# Patient Record
Sex: Female | Born: 1995 | Race: Black or African American | Hispanic: No | Marital: Single | Smoking: Current every day smoker
Health system: Southern US, Community
[De-identification: ages and names within clinical notes are randomized; demographics above are authoritative.]

## PROBLEM LIST (undated history)

## (undated) ENCOUNTER — Ambulatory Visit (HOSPITAL_COMMUNITY): Admission: EM | Source: Home / Self Care

## (undated) DIAGNOSIS — L309 Dermatitis, unspecified: Secondary | ICD-10-CM

## (undated) DIAGNOSIS — Z789 Other specified health status: Secondary | ICD-10-CM

## (undated) DIAGNOSIS — O24419 Gestational diabetes mellitus in pregnancy, unspecified control: Secondary | ICD-10-CM

## (undated) DIAGNOSIS — A749 Chlamydial infection, unspecified: Secondary | ICD-10-CM

## (undated) HISTORY — DX: Gestational diabetes mellitus in pregnancy, unspecified control: O24.419

## (undated) HISTORY — PX: NO PAST SURGERIES: SHX2092

## (undated) HISTORY — DX: Chlamydial infection, unspecified: A74.9

## (undated) HISTORY — DX: Dermatitis, unspecified: L30.9

---
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
## 2018-09-29 ENCOUNTER — Telehealth: Payer: Self-pay | Admitting: Family Medicine

## 2018-09-29 NOTE — Telephone Encounter (Signed)
Spoke with patient about her appointment on 6/12 @ 10:20. Patient instructed to wear a face mask for the entire appointment and no visitors are allowed. Patient was screened for covid symptoms and denied having any.

## 2018-09-30 ENCOUNTER — Ambulatory Visit (INDEPENDENT_AMBULATORY_CARE_PROVIDER_SITE_OTHER): Payer: Medicaid Other | Admitting: *Deleted

## 2018-09-30 ENCOUNTER — Other Ambulatory Visit: Payer: Self-pay

## 2018-09-30 DIAGNOSIS — Z3201 Encounter for pregnancy test, result positive: Secondary | ICD-10-CM | POA: Diagnosis not present

## 2018-09-30 DIAGNOSIS — Z3200 Encounter for pregnancy test, result unknown: Secondary | ICD-10-CM

## 2018-09-30 LAB — POCT PREGNANCY, URINE: Preg Test, Ur: POSITIVE — AB

## 2018-09-30 MED ORDER — PRENATAL VITAMIN 27-0.8 MG PO TABS: 1.0000 | ORAL_TABLET | Freq: Every day | ORAL | 12 refills | Status: DC

## 2018-09-30 NOTE — Progress Notes (Signed)
Pt presents for pregnancy test. She was advised of result - positive. This is a desired pregnancy and she is very happy. Sure LMP 08/13/18. EDD 05/20/19.  Medication reconciliation completed. Pt advised to begin taking prenatal vitamins and begin prenatal care. She is having some mild nausea. Dietary suggestions given. Pt advised to got to [facility] if she develops severe abdominal/pelvic pain or vaginal bleeding. Pt voiced understanding of all information and instructions given.

## 2018-10-07 NOTE — Progress Notes (Signed)
I have reviewed the chart and agree with nursing staff's documentation of this patient's encounter.  Darrell Hauk, MD 10/07/2018 3:06 PM    

## 2018-11-01 ENCOUNTER — Other Ambulatory Visit: Payer: Self-pay

## 2018-11-01 ENCOUNTER — Telehealth (INDEPENDENT_AMBULATORY_CARE_PROVIDER_SITE_OTHER): Payer: Medicaid Other | Admitting: *Deleted

## 2018-11-01 ENCOUNTER — Encounter: Payer: Self-pay | Admitting: *Deleted

## 2018-11-01 VITALS — Ht 67.0 in

## 2018-11-01 DIAGNOSIS — Z3400 Encounter for supervision of normal first pregnancy, unspecified trimester: Secondary | ICD-10-CM

## 2018-11-01 DIAGNOSIS — L309 Dermatitis, unspecified: Secondary | ICD-10-CM | POA: Insufficient documentation

## 2018-11-01 NOTE — Progress Notes (Signed)
[place] pt in order to begin scheduled virtual visit via MyChart video. Pt did not answer. I left a message stating that I will call back in a few minutes.   I connected with  Aggie Cosier on 11/01/18 at 10:40 AM EDT by telephone and verified that I am speaking with the correct person using two identifiers.   I discussed the limitations, risks, security and privacy concerns of performing an evaluation and management service by telephone and the availability of in person appointments. I also discussed with the patient that there may be a patient responsible charge related to this service. The patient expressed understanding and agreed to proceed.  New Ob intake completed. Pt advised that her prenatal care will include a combination of virtual visits as well as face to face visits. Covid visitor restrictions explained. Pt is adopted and did not know pertinent family medical history except that her birth mother died of Breast Cancer. Pt agreed to check BP weekly and record results to [employer]. She has applied for Medicaid however has not yet received her card/letter. She will need BP cuff ordered once she receives Medicaid approval. Pt voiced understanding of all information and instructions given.   Day, Ronnell Freshwater, RN 11/01/2018  11:34 AM

## 2018-11-02 NOTE — Progress Notes (Signed)
Chart reviewed for nurse visit. Agree with plan of care.   Starr [place], [place] 11/02/2018 9:43 AM

## 2018-11-07 ENCOUNTER — Telehealth: Payer: Self-pay | Admitting: Obstetrics and Gynecology

## 2018-11-07 NOTE — Telephone Encounter (Signed)
Called the patient to complete the pre-screen. Left a detailed voicemail of wearing a face mask, sanitizing hands at the sanitizing station upon entering our office, and no visitors or children are allowed due to the COVID19 restrictions. Also informed the patient if she is experiencing any flu like symptoms please call our office to reschedule. °

## 2018-11-08 ENCOUNTER — Ambulatory Visit (INDEPENDENT_AMBULATORY_CARE_PROVIDER_SITE_OTHER): Payer: Medicaid Other | Admitting: Student

## 2018-11-08 ENCOUNTER — Other Ambulatory Visit: Payer: Self-pay

## 2018-11-08 VITALS — BP 121/79 | HR 93 | Wt 163.0 lb

## 2018-11-08 DIAGNOSIS — Z3401 Encounter for supervision of normal first pregnancy, first trimester: Secondary | ICD-10-CM

## 2018-11-08 DIAGNOSIS — Z3400 Encounter for supervision of normal first pregnancy, unspecified trimester: Secondary | ICD-10-CM

## 2018-11-08 DIAGNOSIS — Z3481 Encounter for supervision of other normal pregnancy, first trimester: Secondary | ICD-10-CM | POA: Diagnosis not present

## 2018-11-08 DIAGNOSIS — Z315 Encounter for genetic counseling: Secondary | ICD-10-CM | POA: Diagnosis not present

## 2018-11-08 DIAGNOSIS — Z3A12 12 weeks gestation of pregnancy: Secondary | ICD-10-CM

## 2018-11-08 MED ORDER — AMBULATORY NON FORMULARY MEDICATION: 1.0000 | 0 refills | Status: DC

## 2018-11-08 NOTE — Progress Notes (Signed)
  Subjective:    Christine Bailey is being seen today for her first obstetrical visit.  This is not a planned pregnancy but she is happy about it. She is at [redacted]w[redacted]d gestation. Her obstetrical history is significant for none. Relationship with FOB: significant other, not living together. Patient does not intend to breast feed. Pregnancy history fully reviewed.  Patient reports no complaints.  Review of Systems:   Review of Systems  Constitutional: Negative.   HENT: Negative.   Respiratory: Negative.   Cardiovascular: Negative.   Genitourinary: Negative.   Neurological: Negative.     Objective:     BP 121/79   Pulse 93   Wt 163 lb (73.9 kg)   LMP 08/13/2018 (Approximate)   BMI 25.53 kg/m  Physical Exam  Constitutional: She appears well-developed and well-nourished.  HENT:  Head: Normocephalic.  Neck: Normal range of motion.  Respiratory: Effort normal.  GI: Soft.  Musculoskeletal: Normal range of motion.  Neurological: She is alert.  Skin: Skin is warm and dry.    Exam    Assessment:    Pregnancy: G1P0 Patient Active Problem List   Diagnosis Date Noted  . Supervision of low-risk first pregnancy 11/01/2018  . Eczema 11/01/2018       Plan:     Initial labs drawn. Prenatal vitamins. Problem list reviewed and updated. AFP3 discussed: will do at anatomy scan. . Role of ultrasound in pregnancy discussed; fetal survey: ordered. Amniocentesis discussed: not indicated. Follow up in 8 weeks. 50% of 62min visit spent on counseling and coordination of care.  -Patient enrolled in [facility]; understands that her next visit will be virutal.  -Pap deferred today due to age.  -Discussed birth control; patient does not want hormonal methods.   The nature of [facility] with multiple MDs and other Advanced Practice Providers was explained to patient; also emphasized that residents, students are part of our team.   Mervyn Skeeters  [facility] 11/08/2018

## 2018-11-09 LAB — OBSTETRIC PANEL, INCLUDING HIV
Antibody Screen: NEGATIVE
Basophils Absolute: 0 10*3/uL (ref 0.0–0.2)
Basos: 0 %
EOS (ABSOLUTE): 0.2 10*3/uL (ref 0.0–0.4)
Eos: 2 %
HIV Screen 4th Generation wRfx: NONREACTIVE
Hematocrit: 33.9 % — ABNORMAL LOW (ref 34.0–46.6)
Hemoglobin: 11.9 g/dL (ref 11.1–15.9)
Hepatitis B Surface Ag: NEGATIVE
Immature Grans (Abs): 0 10*3/uL (ref 0.0–0.1)
Immature Granulocytes: 0 %
Lymphocytes Absolute: 1.6 10*3/uL (ref 0.7–3.1)
Lymphs: 16 %
MCH: 32.5 pg (ref 26.6–33.0)
MCHC: 35.1 g/dL (ref 31.5–35.7)
MCV: 93 fL (ref 79–97)
Monocytes Absolute: 0.6 10*3/uL (ref 0.1–0.9)
Monocytes: 6 %
Neutrophils Absolute: 7.9 10*3/uL — ABNORMAL HIGH (ref 1.4–7.0)
Neutrophils: 76 %
Platelets: 267 10*3/uL (ref 150–450)
RBC: 3.66 x10E6/uL — ABNORMAL LOW (ref 3.77–5.28)
RDW: 11.6 % — ABNORMAL LOW (ref 11.7–15.4)
RPR Ser Ql: NONREACTIVE
Rh Factor: POSITIVE
Rubella Antibodies, IGG: 1.06 index (ref >0.99)
WBC: 10.4 10*3/uL (ref 3.4–10.8)

## 2018-11-18 ENCOUNTER — Encounter: Payer: Self-pay | Admitting: *Deleted

## 2018-12-06 ENCOUNTER — Other Ambulatory Visit: Payer: Self-pay

## 2018-12-06 MED ORDER — BLOOD PRESSURE KIT DEVI: 1.0000 | 0 refills | Status: DC | PRN

## 2018-12-10 DIAGNOSIS — Z3400 Encounter for supervision of normal first pregnancy, unspecified trimester: Secondary | ICD-10-CM | POA: Diagnosis not present

## 2018-12-12 ENCOUNTER — Telehealth: Payer: Self-pay

## 2018-12-12 NOTE — Telephone Encounter (Signed)
Notified pt that we have her Bp cuff.  Pt reports that she is unsure of when she will be able to come pick up the cuff.  I verified with pt if she will be able to send a Mychart message when she is available to come pick up cuff.  Pt stated yes once she is able to confirm time she will send MyChart message.

## 2018-12-23 ENCOUNTER — Other Ambulatory Visit: Payer: Self-pay

## 2018-12-23 ENCOUNTER — Ambulatory Visit (HOSPITAL_COMMUNITY)
Admission: RE | Admit: 2018-12-23 | Discharge: 2018-12-23 | Disposition: A | Discharge status: Home / Self Care | Payer: Medicaid Other | Source: Ambulatory Visit | Attending: Obstetrics and Gynecology | Admitting: Obstetrics and Gynecology

## 2018-12-23 ENCOUNTER — Other Ambulatory Visit: Payer: Self-pay | Admitting: Student

## 2018-12-23 DIAGNOSIS — Z148 Genetic carrier of other disease: Secondary | ICD-10-CM | POA: Diagnosis not present

## 2018-12-23 DIAGNOSIS — Z363 Encounter for antenatal screening for malformations: Secondary | ICD-10-CM | POA: Diagnosis not present

## 2018-12-23 DIAGNOSIS — Z3400 Encounter for supervision of normal first pregnancy, unspecified trimester: Secondary | ICD-10-CM

## 2018-12-23 DIAGNOSIS — Z3A18 18 weeks gestation of pregnancy: Secondary | ICD-10-CM | POA: Diagnosis not present

## 2019-01-03 ENCOUNTER — Telehealth (INDEPENDENT_AMBULATORY_CARE_PROVIDER_SITE_OTHER): Payer: Medicaid Other | Admitting: Medical

## 2019-01-03 ENCOUNTER — Other Ambulatory Visit: Payer: Self-pay

## 2019-01-03 ENCOUNTER — Telehealth: Payer: Self-pay

## 2019-01-03 ENCOUNTER — Encounter: Payer: Self-pay | Admitting: Medical

## 2019-01-03 DIAGNOSIS — Z3A20 20 weeks gestation of pregnancy: Secondary | ICD-10-CM

## 2019-01-03 DIAGNOSIS — Z3400 Encounter for supervision of normal first pregnancy, unspecified trimester: Secondary | ICD-10-CM

## 2019-01-03 DIAGNOSIS — Z3402 Encounter for supervision of normal first pregnancy, second trimester: Secondary | ICD-10-CM

## 2019-01-03 NOTE — Telephone Encounter (Signed)
Called pt to make aware that her BP Cuff is waiting for pick up at [place] on [facility] [address] & gave their contact#.No  answer, left VM.

## 2019-01-03 NOTE — Patient Instructions (Addendum)
Second Trimester of Pregnancy  The second trimester is from week 14 through week 27 (month 4 through 6). This is often the time in pregnancy that you feel your best. Often times, morning sickness has lessened or quit. You may have more energy, and you may get hungry more often. Your unborn baby is growing rapidly. At the end of the sixth month, he or she is about 9 inches long and weighs about 1 pounds. You will likely feel the baby move between 18 and 20 weeks of pregnancy. Follow these instructions at home: Medicines  Take over-the-counter and prescription medicines only as told by your doctor. Some medicines are safe and some medicines are not safe during pregnancy.  Take a prenatal vitamin that contains at least 600 micrograms (mcg) of folic acid.  If you have trouble pooping (constipation), take medicine that will make your stool soft (stool softener) if your doctor approves. Eating and drinking   Eat regular, healthy meals.  Avoid raw meat and uncooked cheese.  If you get low calcium from the food you eat, talk to your doctor about taking a daily calcium supplement.  Avoid foods that are high in fat and sugars, such as fried and sweet foods.  If you feel sick to your stomach (nauseous) or throw up (vomit): ? Eat 4 or 5 small meals a day instead of 3 large meals. ? Try eating a few soda crackers. ? Drink liquids between meals instead of during meals.  To prevent constipation: ? Eat foods that are high in fiber, like fresh fruits and vegetables, whole grains, and beans. ? Drink enough fluids to keep your pee (urine) clear or pale yellow. Activity  Exercise only as told by your doctor. Stop exercising if you start to have cramps.  Do not exercise if it is too hot, too humid, or if you are in a place of great height (high altitude).  Avoid heavy lifting.  Wear low-heeled shoes. Sit and stand up straight.  You can continue to have sex unless your doctor tells you not to.  Relieving pain and discomfort  Wear a good support bra if your breasts are tender.  Take warm water baths (sitz baths) to soothe pain or discomfort caused by hemorrhoids. Use hemorrhoid cream if your doctor approves.  Rest with your legs raised if you have leg cramps or low back pain.  If you develop puffy, bulging veins (varicose veins) in your legs: ? Wear support hose or compression stockings as told by your doctor. ? Raise (elevate) your feet for 15 minutes, 3-4 times a day. ? Limit salt in your food. Prenatal care  Write down your questions. Take them to your prenatal visits.  Keep all your prenatal visits as told by your doctor. This is important. Safety  Wear your seat belt when driving.  Make a list of emergency phone numbers, including numbers for family, friends, the hospital, and police and fire departments. General instructions  Ask your doctor about the right foods to eat or for help finding a counselor, if you need these services.  Ask your doctor about local prenatal classes. Begin classes before month 6 of your pregnancy.  Do not use hot tubs, steam rooms, or saunas.  Do not douche or use tampons or scented sanitary pads.  Do not cross your legs for long periods of time.  Visit your dentist if you have not done so. Use a soft toothbrush to brush your teeth. Floss gently.  Avoid all smoking, herbs,   and alcohol. Avoid drugs that are not approved by your doctor.  Do not use any products that contain nicotine or tobacco, such as cigarettes and e-cigarettes. If you need help quitting, ask your doctor.  Avoid cat litter boxes and soil used by cats. These carry germs that can cause birth defects in the baby and can cause a loss of your baby (miscarriage) or stillbirth. Contact a doctor if:  You have mild cramps or pressure in your lower belly.  You have pain when you pee (urinate).  You have bad smelling fluid coming from your vagina.  You continue to  feel sick to your stomach (nauseous), throw up (vomit), or have watery poop (diarrhea).  You have a nagging pain in your belly area.  You feel dizzy. Get help right away if:  You have a fever.  You are leaking fluid from your vagina.  You have spotting or bleeding from your vagina.  You have severe belly cramping or pain.  You lose or gain weight rapidly.  You have trouble catching your breath and have chest pain.  You notice sudden or extreme puffiness (swelling) of your face, hands, ankles, feet, or legs.  You have not felt the baby move in over an hour.  You have severe headaches that do not go away when you take medicine.  You have trouble seeing. Summary  The second trimester is from week 14 through week 27 (months 4 through 6). This is often the time in pregnancy that you feel your best.  To take care of yourself and your unborn baby, you will need to eat healthy meals, take medicines only if your doctor tells you to do so, and do activities that are safe for you and your baby.  Call your doctor if you get sick or if you notice anything unusual about your pregnancy. Also, call your doctor if you need help with the right food to eat, or if you want to know what activities are safe for you. This information is not intended to replace advice given to you by your health care provider. Make sure you discuss any questions you have with your health care provider. Document Released: 07/01/2009 Document Revised: 07/29/2018 Document Reviewed: 05/12/2016 Elsevier Patient Education  2020 Elsevier Inc.  AREA PEDIATRIC/FAMILY PRACTICE PHYSICIANS  [place] ([address]) . [facility] o Chambliss, MD; Eniola, MD; Hale, MD; Hensel, MD; McDiarmid, MD; McIntyer, MD; Neal, MD; Walden, MD o [address] [place], [place] [address] o [phone] o Mon-Fri 8:30-12:30, 1:30-5:00 o Providers come to see babies at [facility] o Accepting  Medicaid . [facility] o Limited providers who accept newborns: Koirala, MD; Morrow, MD; Wolters, MD o [address], [place], [place] [address] o [phone] o Mon-Fri 8:00-5:30 o Babies seen by providers at [facility] o Does NOT accept Medicaid o Please call early in hospitalization for appointment (limited availability)  . [facility] o Mulberry, MD o [address] [place], [place] [address] o [phone] o Mon, Tue, Thur, Fri 8:30-5:00, Wed 10:00-7:00 (closed 1-2pm) o Babies seen by [facility] providers o Accepting Medicaid . [facility] - Pediatrician o Rubin, MD o [address], [place], [place] [address] o [phone] o Mon-Fri 8:30-5:00, Sat 8:30-12:00 o Provider comes to see babies at [facility] o Accepting Medicaid o Must have been referred from current patients or contacted office prior to delivery . [facility]) Leotis Pain, MD; Ave Filter, MD; Luna Fuse, MD; Kennedy Bucker, MD; Konrad Dolores, MD; Kathlene November, MD; Jenne Campus, MD; Lubertha South, MD; Wynetta Emery, MD; Duffy Rhody, MD; Gerre Couch, NP; Shirl [place], NP o [address] [place], [place], [place] [address] o [phone] o Mon, Tue, Thur, Fri 8:30-5:30, Wed 9:30-5:30, Sat 8:30-12:30 o Babies seen by [facility] providers o Accepting Medicaid o Only accepting infants of first-time parents or siblings of current patients [facility] discharge coordinator will make follow-up appointment . Cyril Mourning o [address] [place], [place]  [address] o [phone]   Fax - [phone] . [facility] o [address], [place], [place]  [address] o Phone - [phone]   Fax - [phone] . Lucio Edward o [address], [place], [place]  [address] o [phone]  [place] [phone]) . [facility] Jorge Mandril, MD; Alita Chyle, MD; Princella Ion, MD; MD; Earlene Plater,  MD; Jamesetta Orleans, MD; Alvera Novel, MD; Clarene Duke, MD; Rana Snare, MD; Carmon Ginsberg, MD; Alinda Money, MD; Hosie Poisson, MD; Mayford Knife, MD o [address] [address] [place], [place] [address] o [phone] o Mon-Fri 8:30-5:00 (extended evenings Mon-Thur as needed), Sat-Sun 10:00-1:00 o Providers come to see babies at [facility] o Accepting Medicaid for families of first-time babies and families with all children in the household age 79 and under. Must register with office prior to making appointment (M-F only). [facility] Odella Aquas, NP; Lynelle Doctor, MD; Susann Givens, MD; [place], [place] o [address] [place], [place] [address] o [phone] o Mon-Fri 8:00-5:00 o Babies seen by providers at [facility] o Does NOT accept Medicaid/Commercial Insurance Only . [facility] [place] [facility]  Suzette Battiest, MD; Zachery Dauer, MD; Stefan Church, MD; Sabino Dick, MD; Quitman Livings, MD; Farris Has, MD; Gaynell Face, MD; Betha Loa, MD; Colon Flattery, MD; Clifton James, MD o [address] [place] [place], [place] [address] o [phone] o Mon-Fri 8:30-5:30, Sat (Oct.-Mar.) 9:00-1:00 o Babies seen by providers at [facility] o Accepting [facility] [phone]) . [facility] Dimitri, MD; Sheliah Hatch, MD o [address], [place], [place] [address] o [phone] o Mon-Fri 8:30-5:00, Sat 8:30-12:00 o Providers come to see babies at [facility] o Does NOT accept Medicaid . [facility] Cindy Hazy, [place]; [place], MD; [place], [place]; Wynelle Link, MD; Azucena Cecil, MD o [address] [place], [place] [address] o [phone] o Mon-Fri 8:00-5:00 o Babies seen by providers at [facility] o Does NOT accept Medicaid o Only accepting babies of parents who are patients o Please call early in hospitalization for appointment (limited availability) . [facility] Lamar Benes, MD; Abran Cantor, MD; Early Osmond, MD; Cherre Huger, NP; Hyacinth Meeker, MD; Dwan Bolt, MD; Jarold Motto, NP; Dario Guardian, MD; Talmage Nap,  MD; Maisie Fus, MD; Pricilla Holm, MD; Tama [place], MD o [address] [place], [place], [place] [address] o [phone] o Mon-Fri 8:00-5:00, Sat 9:00-12:00 o Providers come to see babies at [facility] o Does NOT accept [facility] [phone]) . [facility] o Limited providers accepting new patients: Drema Pry, NP; Roberts, PA o [address] [place], [place] [address] o [phone] o Mon-Fri 8:00-5:00 o Babies seen by providers at [facility] o Does NOT accept Medicaid o Only accepting babies of parents who are patients o Please call early in hospitalization for appointment (limited availability) . [facility] Luan Pulling, MD; Nash Dimmer, MD o [address] [place], [place] [address] o [phone] (press 1 to schedule appointment) o Mon-Fri 8:00-5:00 o  Providers come to see babies at [facility] o Does NOT accept Medicaid . [facility] Cristino Martes, MD o [address], [place], [place] [address] o [phone] o Mon-Fri 8:30-5:00 (lunch 12:30-1:00), extended hours by appointment only Wed 5:00-6:30 o Babies seen by [facility] providers o Accepting Medicaid . [facility] Abbot, MD; [place], MD; Hassan Rowan, MD o [address] [place], [place], [place] [address] o [phone] o Mon-Fri 8:00-5:00 o Babies seen by [facility] providers o Does NOT accept Medicaid . Nature conservation officer at [place] [address] Elsworth Soho, MD; Durene Cal, MD; [place], DO o [address] [place], [place] [address] o [phone] o Mon-Fri 8:00-5:00 o Babies seen by [facility] providers o Does NOT accept Medicaid . [facility] o [place], [place]; [place], [place]; [place], NP; Avis Epley, MD; Vonna Kotyk, MD; Clance Boll, MD; Stevphen [place], NP; Arvilla Market, NP; Ann Maki, NP; Otis Dials, NP; Vaughan Basta, MD; Drayton, MD o [address] [place], [place] [address] o [phone] o Mon-Fri 8:30-5:00, Sat 10:00-1:00 o Providers come to see  babies at [facility] o Does NOT accept Medicaid o Free prenatal information session Tuesdays at 4:45pm . [facility] Luna Kitchens, MD; [place], [place]; [place], [place]; [place], [place] o [address] [place] [address] o [phone] o Mon-Fri 7:30-5:30 o Babies seen by [facility] providers . [facility] Children's Doctor o [address], [place], [place]  [address] o [phone]   Fax - [phone]  [place] [phone] & [phone]) . [facility] Alphonsa Overall, MD o [address] [place], [place] [address] o [phone] o Mon-Thur 8:00-6:00 o Providers come to see babies at [facility] o Accepting Medicaid . [facility] Zenon Mayo, NP; Cyndia Bent, MD; [place], [place]; [place], [place] o [address] [place], [place] [address] o [phone] o Mon-Thur 7:30-7:30, Fri 7:30-4:30 o Babies seen by [facility] providers o Accepting Medicaid . [facility] Cheryle Horsfall, MD; Janene Harvey, NP; Vonita Moss, MD o [address], [place], [place] [address] o [phone] o Mon-Fri 8:30-5:00, Sat 8:30-12:00 o Providers come to see babies at [facility] o Accepting Medicaid o Must have "Meet & Greet" appointment at office prior to delivery . [facility] Llana Aliment, MD; Earlene Plater, MD; Lucretia Roers, MD o [address], [place], [place] [address] o [phone] o Mon-Wed 8:00-6:00, Thur-Fri 8:00-5:00, Sat 9:00-12:00 o Providers come to see babies at [facility] o Does NOT accept Medicaid o Only accepting siblings of current patients . [facility]  o [address], [place], [place]  [address] o [phone]   Fax - [phone] . [facility] at [facility] o [phone] [address], [place], [place]  [address] o [phone]   Fax - [phone]  [place]  [phone] & [phone])  . Nature conservation officer at [facility] o [place], DO; [place], DO o [address] [place], [place] [address] o [phone] o Mon-Fri 7:00-5:00 o Babies seen by [facility] providers o Does NOT accept Medicaid . [facility] Ellis Savage, MD; [place], [place]; [place], [place] o [address], [place], [place] [address] o [phone] o Mon-Fri 8:00-5:00 o Babies seen by [facility] providers o Accepting Medicaid . [facility] - [address] Franne Forts, MD; [place], [place]; [place], NP; [place], [place] o [address] [place], [place], [place] [address] o [phone] o Mon-Fri 8:00-5:00 o Babies seen by providers at  [facility] o Accepting Medicaid  [place] ([address]) . [facility] [place] o [place], DO o [address] [place], [place] [address] o [phone] o Mon-Fri 8:00-5:00 o Babies seen by [facility] providers o Does NOT accept Medicaid o Limited availability, please call early in hospitalization to schedule follow-up . [facility] o Calderon, PA; Cummings, MD; Dillard, MD; Martin, PA; Olson, MD; Van[place], PA o [address], [place], [place] [address] o [phone] o Mon-Fri 8:30-5:00, Sat 9:00-12:00 o Babies seen by providers at [facility] o Accepting Medicaid o Please register online then schedule online or call office o [email or web address] . [facility] o Hunter, NP; Kumar, MD; Martin Rogers, PA o [address], [place], [place] [address] o [phone] o Mon-Fri 8:00-5:00 o Babies seen by providers at [facility] o Accepting Medicaid . [facility] o [place], MD; Kristi Fleenor, NP; [place], MD o [address], [place], [place] [address] o [phone] o Mon-Fri  8:00-5:30, Sat&Sun by appointment (phones open at 8:30) o Babies seen by [facility] providers o Accepting Medicaid o Must be a first-time baby or sibling of current patient . [facility] [place]  o [address], [place], [place]  [address] o [phone]   Fax - [phone]  [place] ([phone]) . [facility] [place] [facility] o Brown, PA; Cowen, PA; Rice, MD; Helton, PA; Spry, MD o [address] [place], [place] [address] o [phone] o Mon-Thur 8:00-7:00, Fri 8:00-5:00, Sat 8:00-12:00, Sun 9:00-12:00 o Babies seen by [facility] providers o Accepting Medicaid . [facility] [place] o Coe-Goins, MD; Marshall, MD; Pierre-Louis, MD o [address] [place] [address], [place], [place] [address] o [phone] o Mon-Thur 8:00-5:00 o Babies seen by providers at [facility] o Accepting Medicaid . [facility] o Bratton, MD; Coe-Goins, MD; Hayes, MD; Lewis, MD; List, MD; Lott, MD; Marshall, MD; Moran, MD; O'Neal, MD; Pierre-Louis, MD; Pitonzo, MD; Scholer, MD; Spangle, MD o [address] [place], [place] [address] o [phone] o Mon-Fri 8:00-5:30, Sat (Oct.-Mar.) 9:00-1:00 o Babies seen by providers at [facility] o Accepting Medicaid o Must fill out new patient packet, available online at [email or web address] . [facility] ([facility] o Friddle, NP; [place], NP; Kelly, NP; Logan, MD; Melvin, PA; Poth, MD; Ramadoss, MD; Stanton, NP o [address], [place], [place] [address] o [phone] o Mon-Thur 8:00-5:30, Fri 8:00-5:00 o Babies seen by providers at [facility] o Accepting Medicaid  [place] ([address]) . [facility] o Dixon, PA; [place], MD; Pickard, MD; Tapia, PA o [address], [place], [place] [address] o [phone] o Mon-Fri  8:00-5:00 o Babies seen by providers at [facility] o Accepting Medicaid   [place] ([address]) . [facility] [place] o Masneri, DO; Meyers, MD; Nelson, PA o [address], [place], [place] [address] o [phone] o Mon-Fri 8:00-5:00 o Babies seen by providers at [facility] o Does NOT accept Medicaid o Limited appointment availability, please call early in hospitalization  . [facility] [place] o Kunedd, DO; McGowen, MD o [address], [place], [place] [address]   o [phone] o Mon-Fri 8:00-5:00 o Babies seen by [facility]  providers o Does NOT accept Medicaid . [facility] Su Grand, MD; Guy Sandifer, MD; [place], [place]; [place], [place] [address], [place], [place] [address] o [phone] o Mon-Fri 8:00-5:00 o After hours clinic [facility] [place], [place] [address]) [phone] Mon-Fri 5:00-8:00, Sat 12:00-6:00, Sun 10:00-4:00 o Babies seen by [facility] providers o Accepting Medicaid . [place] [facility] o [address] [place], [place]  [address] o [phone]   Fax - [phone]  [place] [phone]) . Therapist, music at [facility], MD o [address] [place], [place], [place] [address] o [phone] o Mon-Fri 8:00-5:00 o Babies seen by [facility] providers o Does NOT accept Medicaid . [place] [facility] [facility] [place]) Bing Neighbors, MD o [address] [place], [place], [place] [address] o [phone] o Mon-Thur 8:00-7:00, Fri 8:00-5:00, Sat 8:00-12:00 o Babies seen by providers at [facility] o Accepting Medicaid - but does not have vaccinations in office (must be received elsewhere) o Limited availability, please call early in hospitalization  [place] ([address]) . [place], [place] [address] [place] [address] o [phone]  Fax [phone]

## 2019-01-03 NOTE — Progress Notes (Signed)
I connected with Christine Bailey  on 01/03/19 at  9:15 AM EDT by: MyChart and verified that I am speaking with the correct person using two identifiers.  Patient is located at home and provider is located at [facility].     The purpose of this virtual visit is to provide medical care while limiting exposure to the novel coronavirus. I discussed the limitations, risks, security and privacy concerns of performing an evaluation and management service by MyChart and the availability of in person appointments. I also discussed with the patient that there may be a patient responsible charge related to this service. By engaging in this virtual visit, you consent to the provision of healthcare.  Additionally, you authorize for your insurance to be billed for the services provided during this visit.  The patient expressed understanding and agreed to proceed.  The following staff members participated in the virtual visit:  Carver Fila, [place] VISIT NOTE  Subjective:  Christine Bailey is a 23 y.o. G1P0 at [redacted]w[redacted]d  for phone visit for ongoing prenatal care.  She is currently monitored for the following issues for this low-risk pregnancy and has Supervision of low-risk first pregnancy and Eczema on their problem list.  Patient reports no complaints.  Contractions: Not present. Vag. Bleeding: None.  Movement: Present. Denies leaking of fluid.   The following portions of the patient's history were reviewed and updated as appropriate: allergies, current medications, past family history, past medical history, past social history, past surgical history and problem list.   Objective:  There were no vitals filed for this visit. Self-Obtained  Fetal Status:     Movement: Present     Assessment and Plan:  Pregnancy: G1P0 at [redacted]w[redacted]d 1. 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 CMA has re-ordered through [employer] today.   Preterm labor symptoms and general obstetric precautions including but not limited to vaginal bleeding, contractions, leaking of fluid and fetal movement were reviewed in detail with the patient.  Return in about 8 weeks (around 02/28/2019) for LOB, 28 week labs (fasting), In-Person.  No future appointments.   Time spent on virtual visit: 10 minutes  Kerry Hough, PA-C

## 2019-01-03 NOTE — Progress Notes (Signed)
Pt advised that she has not received BP Cuff yet, [place] to check status & they said Cuff is available for pick up. Will call pt to advise.

## 2019-02-23 ENCOUNTER — Other Ambulatory Visit: Payer: Self-pay | Admitting: Lactation Services

## 2019-02-23 DIAGNOSIS — Z3400 Encounter for supervision of normal first pregnancy, unspecified trimester: Secondary | ICD-10-CM

## 2019-02-28 ENCOUNTER — Encounter: Payer: Self-pay | Admitting: Medical

## 2019-02-28 ENCOUNTER — Ambulatory Visit (INDEPENDENT_AMBULATORY_CARE_PROVIDER_SITE_OTHER): Payer: Medicaid Other | Admitting: Medical

## 2019-02-28 ENCOUNTER — Other Ambulatory Visit: Payer: Self-pay

## 2019-02-28 ENCOUNTER — Other Ambulatory Visit: Payer: Medicaid Other

## 2019-02-28 VITALS — BP 124/62 | HR 93 | Wt 183.7 lb

## 2019-02-28 DIAGNOSIS — Z3403 Encounter for supervision of normal first pregnancy, third trimester: Secondary | ICD-10-CM | POA: Diagnosis not present

## 2019-02-28 DIAGNOSIS — Z3A28 28 weeks gestation of pregnancy: Secondary | ICD-10-CM | POA: Diagnosis not present

## 2019-02-28 DIAGNOSIS — Z23 Encounter for immunization: Secondary | ICD-10-CM

## 2019-02-28 DIAGNOSIS — Z3400 Encounter for supervision of normal first pregnancy, unspecified trimester: Secondary | ICD-10-CM

## 2019-02-28 NOTE — Patient Instructions (Signed)
Fetal Movement Counts Patient Name: ________________________________________________ Patient Due Date: ____________________ What is a fetal movement count?  A fetal movement count is the number of times that you feel your baby move during a certain amount of time. This may also be called a fetal kick count. A fetal movement count is recommended for every pregnant woman. You may be asked to start counting fetal movements as early as week 28 of your pregnancy. Pay attention to when your baby is most active. You may notice your baby's sleep and wake cycles. You may also notice things that make your baby move more. You should do a fetal movement count:  When your baby is normally most active.  At the same time each day. A good time to count movements is while you are resting, after having something to eat and drink. How do I count fetal movements? 1. Find a quiet, comfortable area. Sit, or lie down on your side. 2. Write down the date, the start time and stop time, and the number of movements that you felt between those two times. Take this information with you to your health care visits. 3. For 2 hours, count kicks, flutters, swishes, rolls, and jabs. You should feel at least 10 movements during 2 hours. 4. You may stop counting after you have felt 10 movements. 5. If you do not feel 10 movements in 2 hours, have something to eat and drink. Then, keep resting and counting for 1 hour. If you feel at least 4 movements during that hour, you may stop counting. Contact a health care provider if:  You feel fewer than 4 movements in 2 hours.  Your baby is not moving like he or she usually does. Date: ____________ Start time: ____________ Stop time: ____________ Movements: ____________ Date: ____________ Start time: ____________ Stop time: ____________ Movements: ____________ Date: ____________ Start time: ____________ Stop time: ____________ Movements: ____________ Date: ____________ Start time:  ____________ Stop time: ____________ Movements: ____________ Date: ____________ Start time: ____________ Stop time: ____________ Movements: ____________ Date: ____________ Start time: ____________ Stop time: ____________ Movements: ____________ Date: ____________ Start time: ____________ Stop time: ____________ Movements: ____________ Date: ____________ Start time: ____________ Stop time: ____________ Movements: ____________ Date: ____________ Start time: ____________ Stop time: ____________ Movements: ____________ This information is not intended to replace advice given to you by your health care provider. Make sure you discuss any questions you have with your health care provider. Document Released: 05/06/2006 Document Revised: 04/26/2018 Document Reviewed: 05/16/2015 Elsevier Patient Education  2020 Elsevier Inc. Braxton Hicks Contractions Contractions of the uterus can occur throughout pregnancy, but they are not always a sign that you are in labor. You may have practice contractions called Braxton Hicks contractions. These false labor contractions are sometimes confused with true labor. What are Braxton Hicks contractions? Braxton Hicks contractions are tightening movements that occur in the muscles of the uterus before labor. Unlike true labor contractions, these contractions do not result in opening (dilation) and thinning of the cervix. Toward the end of pregnancy (32-34 weeks), Braxton Hicks contractions can happen more often and may become stronger. These contractions are sometimes difficult to tell apart from true labor because they can be very uncomfortable. You should not feel embarrassed if you go to the hospital with false labor. Sometimes, the only way to tell if you are in true labor is for your health care provider to look for changes in the cervix. The health care provider will do a physical exam and may monitor your contractions. If you   are not in true labor, the exam should show  that your cervix is not dilating and your water has not broken. If there are no other health problems associated with your pregnancy, it is completely safe for you to be sent home with false labor. You may continue to have Braxton Hicks contractions until you go into true labor. How to tell the difference between true labor and false labor True labor  Contractions last 30-70 seconds.  Contractions become very regular.  Discomfort is usually felt in the top of the uterus, and it spreads to the lower abdomen and low back.  Contractions do not go away with walking.  Contractions usually become more intense and increase in frequency.  The cervix dilates and gets thinner. False labor  Contractions are usually shorter and not as strong as true labor contractions.  Contractions are usually irregular.  Contractions are often felt in the front of the lower abdomen and in the groin.  Contractions may go away when you walk around or change positions while lying down.  Contractions get weaker and are shorter-lasting as time goes on.  The cervix usually does not dilate or become thin. Follow these instructions at home:   Take over-the-counter and prescription medicines only as told by your health care provider.  Keep up with your usual exercises and follow other instructions from your health care provider.  Eat and drink lightly if you think you are going into labor.  If Braxton Hicks contractions are making you uncomfortable: ? Change your position from lying down or resting to walking, or change from walking to resting. ? Sit and rest in a tub of warm water. ? Drink enough fluid to keep your urine pale yellow. Dehydration may cause these contractions. ? Do slow and deep breathing several times an hour.  Keep all follow-up prenatal visits as told by your health care provider. This is important. Contact a health care provider if:  You have a fever.  You have continuous pain in  your abdomen. Get help right away if:  Your contractions become stronger, more regular, and closer together.  You have fluid leaking or gushing from your vagina.  You pass blood-tinged mucus (bloody show).  You have bleeding from your vagina.  You have low back pain that you never had before.  You feel your baby's head pushing down and causing pelvic pressure.  Your baby is not moving inside you as much as it used to. Summary  Contractions that occur before labor are called Braxton Hicks contractions, false labor, or practice contractions.  Braxton Hicks contractions are usually shorter, weaker, farther apart, and less regular than true labor contractions. True labor contractions usually become progressively stronger and regular, and they become more frequent.  Manage discomfort from Braxton Hicks contractions by changing position, resting in a warm bath, drinking plenty of water, or practicing deep breathing. This information is not intended to replace advice given to you by your health care provider. Make sure you discuss any questions you have with your health care provider. Document Released: 08/20/2016 Document Revised: 03/19/2017 Document Reviewed: 08/20/2016 Elsevier Patient Education  2020 Elsevier Inc.  

## 2019-02-28 NOTE — Progress Notes (Signed)
   PRENATAL VISIT NOTE  Subjective:  Christine Bailey is a 23 y.o. G1P0 at [redacted]w[redacted]d being seen today for ongoing prenatal care.  She is currently monitored for the following issues for this low-risk pregnancy and has Supervision of low-risk first pregnancy and Eczema on their problem list.  Patient reports no complaints.  Contractions: Not present. Vag. Bleeding: None.  Movement: Present. Denies leaking of fluid.   The following portions of the patient's history were reviewed and updated as appropriate: allergies, current medications, past family history, past medical history, past social history, past surgical history and problem list.   Objective:   Vitals:   02/28/19 0848  BP: 124/62  Pulse: 93  Weight: 183 lb 11.2 oz (83.3 kg)    Fetal Status: Fetal Heart Rate (bpm): 148 Fundal Height: 28 cm Movement: Present     General:  Alert, oriented and cooperative. Patient is in no acute distress.  Skin: Skin is warm and dry. No rash noted.   Cardiovascular: Normal heart rate noted  Respiratory: Normal respiratory effort, no problems with respiration noted  Abdomen: Soft, gravid, appropriate for gestational age.  Pain/Pressure: Absent     Pelvic: Cervical exam deferred        Extremities: Normal range of motion.  Edema: Trace  Mental Status: Normal mood and affect. Normal behavior. Normal judgment and thought content.   Assessment and Plan:  Pregnancy: G1P0 at [redacted]w[redacted]d 1. Encounter for supervision of low-risk first pregnancy in third trimester - Doing well, concerned she is not "showing" yet. Discussed that growth appears normal based on last [place] and fundal height today  - 2 hour GTT, CBC, HIV and RPR today  - TDap today - Declined flu  - Still unsure about Peds as she may move to [facility] after the baby is born to be closer to family  - Has BP cuff and brought to office today for teaching with RN    Preterm labor symptoms and general obstetric precautions including but not limited to  vaginal bleeding, contractions, leaking of fluid and fetal movement were reviewed in detail with the patient. Please refer to After Visit Summary for other counseling recommendations.   Return in about 4 weeks (around 03/28/2019) for LOB, Virtual.  No future appointments.  Kerry Hough, PA-C

## 2019-03-01 LAB — CBC
Hematocrit: 32.2 % — ABNORMAL LOW (ref 34.0–46.6)
Hemoglobin: 11.1 g/dL (ref 11.1–15.9)
MCH: 32.6 pg (ref 26.6–33.0)
MCHC: 34.5 g/dL (ref 31.5–35.7)
MCV: 94 fL (ref 79–97)
Platelets: 218 10*3/uL (ref 150–450)
RBC: 3.41 x10E6/uL — ABNORMAL LOW (ref 3.77–5.28)
RDW: 11.6 % — ABNORMAL LOW (ref 11.7–15.4)
WBC: 11.8 10*3/uL — ABNORMAL HIGH (ref 3.4–10.8)

## 2019-03-01 LAB — GLUCOSE TOLERANCE, 2 HOURS W/ 1HR
Glucose, 1 hour: 126 mg/dL (ref 65–179)
Glucose, 2 hour: 133 mg/dL (ref 65–152)
Glucose, Fasting: 77 mg/dL (ref 65–91)

## 2019-03-01 LAB — HIV ANTIBODY (ROUTINE TESTING W REFLEX): HIV Screen 4th Generation wRfx: NONREACTIVE

## 2019-03-01 LAB — RPR: RPR Ser Ql: NONREACTIVE

## 2019-03-28 ENCOUNTER — Encounter: Payer: Self-pay | Admitting: Student

## 2019-03-28 ENCOUNTER — Other Ambulatory Visit: Payer: Self-pay

## 2019-03-28 ENCOUNTER — Telehealth: Payer: Self-pay | Admitting: Student

## 2019-03-28 ENCOUNTER — Telehealth: Payer: Medicaid Other | Admitting: Student

## 2019-03-28 NOTE — Progress Notes (Signed)
@  856am lvm that I will attempt to call back in 5 mins and if I miss her again to call our office to reschedule.  @[phone]  lvm to reschedule appointment

## 2019-03-28 NOTE — Progress Notes (Signed)
Pt did not keep virtual appointment

## 2019-03-28 NOTE — Telephone Encounter (Signed)
Attempted to contact patient to get her rescheduled for her missed appointment. No answer, left voicemail for patient to give the office a call back to be rescheduled. No show letter mailed  

## 2019-03-31 ENCOUNTER — Encounter: Payer: Self-pay | Admitting: Family Medicine

## 2019-04-06 ENCOUNTER — Telehealth (INDEPENDENT_AMBULATORY_CARE_PROVIDER_SITE_OTHER): Payer: Medicaid Other

## 2019-04-06 ENCOUNTER — Other Ambulatory Visit: Payer: Self-pay

## 2019-04-06 DIAGNOSIS — Z3403 Encounter for supervision of normal first pregnancy, third trimester: Secondary | ICD-10-CM

## 2019-04-06 DIAGNOSIS — Z3A33 33 weeks gestation of pregnancy: Secondary | ICD-10-CM

## 2019-04-06 NOTE — Patient Instructions (Signed)
Contraception Choices Contraception, also called birth control, refers to methods or devices that prevent pregnancy. Hormonal methods Contraceptive implant  A contraceptive implant is a thin, plastic tube that contains a hormone. It is inserted into the upper part of the arm. It can remain in place for up to 3 years. Progestin-only injections Progestin-only injections are injections of progestin, a synthetic form of the hormone progesterone. They are given every 3 months by a health care provider. Birth control pills  Birth control pills are pills that contain hormones that prevent pregnancy. They must be taken once a day, preferably at the same time each day. Birth control patch  The birth control patch contains hormones that prevent pregnancy. It is placed on the skin and must be changed once a week for three weeks and removed on the fourth week. A prescription is needed to use this method of contraception. Vaginal ring  A vaginal ring contains hormones that prevent pregnancy. It is placed in the vagina for three weeks and removed on the fourth week. After that, the process is repeated with a new ring. A prescription is needed to use this method of contraception. Emergency contraceptive Emergency contraceptives prevent pregnancy after unprotected sex. They come in pill form and can be taken up to 5 days after sex. They work best the sooner they are taken after having sex. Most emergency contraceptives are available without a prescription. This method should not be used as your only form of birth control. Barrier methods Female condom  A female condom is a thin sheath that is worn over the penis during sex. Condoms keep sperm from going inside a woman's body. They can be used with a spermicide to increase their effectiveness. They should be disposed after a single use. Female condom  A female condom is a soft, loose-fitting sheath that is put into the vagina before sex. The condom keeps sperm  from going inside a woman's body. They should be disposed after a single use. Diaphragm  A diaphragm is a soft, dome-shaped barrier. It is inserted into the vagina before sex, along with a spermicide. The diaphragm blocks sperm from entering the uterus, and the spermicide kills sperm. A diaphragm should be left in the vagina for 6-8 hours after sex and removed within 24 hours. A diaphragm is prescribed and fitted by a health care provider. A diaphragm should be replaced every 1-2 years, after giving birth, after gaining more than 15 lb (6.8 kg), and after pelvic surgery. Cervical cap  A cervical cap is a round, soft latex or plastic cup that fits over the cervix. It is inserted into the vagina before sex, along with spermicide. It blocks sperm from entering the uterus. The cap should be left in place for 6-8 hours after sex and removed within 48 hours. A cervical cap must be prescribed and fitted by a health care provider. It should be replaced every 2 years. Sponge  A sponge is a soft, circular piece of polyurethane foam with spermicide on it. The sponge helps block sperm from entering the uterus, and the spermicide kills sperm. To use it, you make it wet and then insert it into the vagina. It should be inserted before sex, left in for at least 6 hours after sex, and removed and thrown away within 30 hours. Spermicides Spermicides are chemicals that kill or block sperm from entering the cervix and uterus. They can come as a cream, jelly, suppository, foam, or tablet. A spermicide should be inserted into the   vagina with an applicator at least 10-15 minutes before sex to allow time for it to work. The process must be repeated every time you have sex. Spermicides do not require a prescription. Intrauterine contraception Intrauterine device (IUD) An IUD is a T-shaped device that is put in a woman's uterus. There are two types:  Hormone IUD.This type contains progestin, a synthetic form of the hormone  progesterone. This type can stay in place for 3-5 years.  Copper IUD.This type is wrapped in copper wire. It can stay in place for 10 years.  Permanent methods of contraception Female tubal ligation In this method, a woman's fallopian tubes are sealed, tied, or blocked during surgery to prevent eggs from traveling to the uterus. Hysteroscopic sterilization In this method, a small, flexible insert is placed into each fallopian tube. The inserts cause scar tissue to form in the fallopian tubes and block them, so sperm cannot reach an egg. The procedure takes about 3 months to be effective. Another form of birth control must be used during those 3 months. Female sterilization This is a procedure to tie off the tubes that carry sperm (vasectomy). After the procedure, the man can still ejaculate fluid (semen). Natural planning methods Natural family planning In this method, a couple does not have sex on days when the woman could become pregnant. Calendar method This means keeping track of the length of each menstrual cycle, identifying the days when pregnancy can happen, and not having sex on those days. Ovulation method In this method, a couple avoids sex during ovulation. Symptothermal method This method involves not having sex during ovulation. The woman typically checks for ovulation by watching changes in her temperature and in the consistency of cervical mucus. Post-ovulation method In this method, a couple waits to have sex until after ovulation. Summary  Contraception, also called birth control, means methods or devices that prevent pregnancy.  Hormonal methods of contraception include implants, injections, pills, patches, vaginal rings, and emergency contraceptives.  Barrier methods of contraception can include female condoms, female condoms, diaphragms, cervical caps, sponges, and spermicides.  There are two types of IUDs (intrauterine devices). An IUD can be put in a woman's uterus to  prevent pregnancy for 3-5 years.  Permanent sterilization can be done through a procedure for males, females, or both.  Natural family planning methods involve not having sex on days when the woman could become pregnant. This information is not intended to replace advice given to you by your health care provider. Make sure you discuss any questions you have with your health care provider. Document Released: 04/06/2005 Document Revised: 04/08/2017 Document Reviewed: 05/09/2016 Elsevier Patient Education  2020 Elsevier Inc.  

## 2019-04-06 NOTE — Progress Notes (Signed)
I connected with  Aggie Cosier on 04/06/19 at 11:15 AM EST by telephone and verified that I am speaking with the correct person using two identifiers.   I discussed the limitations, risks, security and privacy concerns of performing an evaluation and management service by telephone and the availability of in person appointments. I also discussed with the patient that there may be a patient responsible charge related to this service. The patient expressed understanding and agreed to proceed.  Alric Seton, CMA 04/06/2019  11:13 AM   Not at home to take bp will update in Baby Scripts later Explained kick counts to patient

## 2019-04-06 NOTE — Progress Notes (Signed)
   TELEHEALTH OBSTETRICS PRENATAL VIRTUAL VIDEO VISIT ENCOUNTER NOTE  Provider location: [facility] for [facility] at [place]   I connected with Christine Bailey on 04/06/19 at 11:15 AM EST byMyChart Video Encounter at home and verified that I am speaking with the correct person using two identifiers.   I discussed the limitations, risks, security and privacy concerns of performing an evaluation and management service virtually and the availability of in person appointments. I also discussed with the patient that there may be a patient responsible charge related to this service. The patient expressed understanding and agreed to proceed. Subjective:  Christine Bailey is a 23 y.o. G1P0 at [redacted]w[redacted]d being seen today for ongoing prenatal care.  She is currently monitored for the following issues for this low-risk pregnancy and has Supervision of low-risk first pregnancy and Eczema on their problem list.  Patient reports no complaints.  Contractions: Not present. Vag. Bleeding: None.  Movement: Present. Denies any leaking of fluid.   Patient questions when she will have her next in office appt.  Patient states that she worries that she will deliver at 36 weeks.  Patient denies contractions and endorses good fetal movement.  She also denies bleeding, lof, and abnormal vaginal discharge. Patient also unsure of what type of birth control method she will use after delivery, but does desire a method.   The following portions of the patient's history were reviewed and updated as appropriate: allergies, current medications, past family history, past medical history, past social history, past surgical history and problem list.   Objective:  There were no vitals filed for this visit.  Fetal Status:     Movement: Present     General:  Alert, oriented and cooperative. Patient is in no acute distress.  Respiratory: Normal respiratory effort, no problems with respiration noted  Mental Status: Normal mood and affect.  Normal behavior. Normal judgment and thought content.  Rest of physical exam deferred due to type of encounter  Imaging: No results found.  Assessment and Plan:  Pregnancy: G1P0 at [redacted]w[redacted]d 1. Encounter for supervision of low-risk first pregnancy in third trimester -Anticipatory guidance for upcoming appts. -Educated on GBS bacteria including what it is, why we test, and how and when we treat if needed. -Discussed and reviewed postpartum planning including contraception and obtaining a pediatrician. -Encouraged to obtain birth control information from bedsider.org.  Also encouraged to talk to family and friends about methods. -Informed that some methods could be distributed/initiated prior to discharge after delivery.  Preterm labor symptoms and general obstetric precautions including but not limited to vaginal bleeding, contractions, leaking of fluid and fetal movement were reviewed in detail with the patient. I discussed the assessment and treatment plan with the patient. The patient was provided an opportunity to ask questions and all were answered. The patient agreed with the plan and demonstrated an understanding of the instructions. The patient was advised to call back or seek an in-person office evaluation/go to [facility]U at [facility] for any urgent or concerning symptoms. Please refer to After Visit Summary for other counseling recommendations.   I provided 7 minutes of face-to-face time during this encounter.  No follow-ups on file.  No future appointments.  Maryann Conners, [place] for [employer], [place]

## 2019-04-21 NOTE — L&D Delivery Note (Signed)
OB/GYN Faculty Practice Delivery Note  Christine Bailey is a 24 y.o. G1P0 s/p VD at 109w2d. She was admitted for NRFHT in MAU.   ROM: 0h 17m with moderate-meconium stained fluid GBS Status: Positive/-- (01/04 1704) Maximum Maternal Temperature: 98.66F  Labor Progress: . Initial SVE: 1.5/80/-2. Patient received Foley bulb, Pitocin and epidural. SROM occurred. She then progressed to complete.   Delivery Date/Time: 2/1 @ 0410 Delivery: Called to room and patient was complete and pushing. Head delivered in LOA position. No nuchal cord present. Shoulder and body delivered in usual fashion. Infant with spontaneous cry, placed on mother's abdomen, dried and stimulated. Cord clamped x 2 after 1-minute delay, and cut by FOB. Cord blood drawn. Placenta delivered spontaneously with gentle cord traction. Fundus firm with massage and Pitocin. Labia, perineum, vagina, and cervix inspected inspected with no lacerations.  Baby Weight: pending  Placenta: Sent to L&D Complications: None Lacerations: None EBL: 110 mL Analgesia: Epidural   Infant: APGAR (1 MIN): 8   APGAR (5 MINS): 9   APGAR (10 MINS):     Jerilynn Birkenhead, MD OB Family Medicine Fellow, [facility] for [facility], [facility] 05/22/2019, 4:25 AM

## 2019-04-21 NOTE — L&D Delivery Note (Signed)
OB/GYN Faculty Practice Delivery Note  Christine Bailey is a 24 y.o. G2P1001 s/p SVD at [redacted]w[redacted]d. She was admitted for IOL for A1GDM.   ROM: 3h 68m with clear fluid GBS Status:  Negative/-- (11/30 1156) Maximum Maternal Temperature: 98  Labor Progress: . Initial SVE: 3 cm. Received cytotec x3 then started on Pitocin. SROM at 0000. She then progressed to complete.   Delivery Date/Time: 12/22 at 0250 Delivery: Called to room and patient was complete and pushing. Head delivered ROA. No nuchal cord present. Shoulder and body delivered in usual fashion. Infant with spontaneous cry, placed on mother's abdomen, dried and stimulated. Cord clamped x 2 after 1-minute delay, and cut by FOB under my supervision. Cord blood drawn. Placenta delivered spontaneously with gentle cord traction. Fundus firm with massage and Pitocin. Labia, perineum, vagina, and cervix inspected inspected with no lacerations.  Baby Weight: pending  Placenta: Sent to L&D Complications: None Lacerations: none EBL: 100 mL Analgesia: none   Infant:  APGAR (1 MIN): 8  APGAR (5 MINS):9   APGAR (10 MINS):     Casper Harrison, MD [facility] Family Medicine Fellow, [facility] for [facility], [facility] 04/10/2020, 3:15 AM

## 2019-04-24 ENCOUNTER — Other Ambulatory Visit: Payer: Self-pay

## 2019-04-24 ENCOUNTER — Encounter: Payer: Self-pay | Admitting: Nurse Practitioner

## 2019-04-24 ENCOUNTER — Other Ambulatory Visit (HOSPITAL_COMMUNITY)
Admission: RE | Admit: 2019-04-24 | Discharge: 2019-04-24 | Disposition: A | Discharge status: Home / Self Care | Payer: Medicaid Other | Source: Ambulatory Visit | Attending: Nurse Practitioner | Admitting: Nurse Practitioner

## 2019-04-24 ENCOUNTER — Ambulatory Visit (INDEPENDENT_AMBULATORY_CARE_PROVIDER_SITE_OTHER): Payer: Medicaid Other | Admitting: Nurse Practitioner

## 2019-04-24 VITALS — BP 125/77 | HR 96 | Wt 197.4 lb

## 2019-04-24 DIAGNOSIS — O26849 Uterine size-date discrepancy, unspecified trimester: Secondary | ICD-10-CM

## 2019-04-24 DIAGNOSIS — Z3403 Encounter for supervision of normal first pregnancy, third trimester: Secondary | ICD-10-CM | POA: Diagnosis not present

## 2019-04-24 DIAGNOSIS — Z3A36 36 weeks gestation of pregnancy: Secondary | ICD-10-CM

## 2019-04-24 DIAGNOSIS — O26843 Uterine size-date discrepancy, third trimester: Secondary | ICD-10-CM

## 2019-04-24 NOTE — Patient Instructions (Addendum)
Lamaze and upright for birth [facility] for breastfeeding [email or web address]  Childbirth Education Options: [facility] Department Classes:  Childbirth education classes can help you get ready for a positive parenting experience. You can also meet other expectant parents and get free stuff for your baby. Each class runs for five weeks on the same night and costs $45 for the mother-to-be and her support person. Medicaid covers the cost if you are eligible. Call [phone] to register. [facility] Childbirth Education:  [phone] or [phone] or [email or web address]  Baby & Me Class: Discuss newborn & infant parenting and family adjustment issues with other new mothers in a relaxed environment. Each week brings a new speaker or baby-centered activity. We encourage new mothers to join [place] every Thursday at 11:00am. Babies birth until crawling. No registration or fee. Daddy [employer]: This course offers Dads-to-be the tools and knowledge needed to feel confident on their journey to becoming new fathers. Experienced dads, who have been trained as coaches, teach dads-to-be how to hold, comfort, diaper, swaddle and play with their infant while being able to support the new mom as well. A class for men taught by men. $25/dad Big Brother/Big Sister: Let your children share in the joy of a new brother or sister in this special class designed just for them. Class includes discussion about how families care for babies: swaddling, holding, diapering, safety as well as how they can be helpful in their new role. This class is designed for children ages 46 to 30, but any age is welcome. Please register each child individually. $5/child  Mom Talk: This mom-led group offers support and connection to mothers as they journey through the adjustments and struggles of that sometimes overwhelming first year after the birth of a child. Tuesdays at 10:00am and Thursdays at 6:00pm. Babies welcome. No  registration or fee. Breastfeeding Support Group: This group is a mother-to-mother support circle where moms have the opportunity to share their breastfeeding experiences. A Lactation Consultant is present for questions and concerns. Meets each Tuesday at 11:00am. No fee or registration. Breastfeeding Your Baby: Learn what to expect in the first days of breastfeeding your newborn.  This class will help you feel more confident with the skills needed to begin your breastfeeding experience. Many new mothers are concerned about breastfeeding after leaving the hospital. This class will also address the most common fears and challenges about breastfeeding during the first few weeks, months and beyond. (call for fee) Comfort Techniques and Tour: This 2 hour interactive class will provide you the opportunity to learn & practice hands-on techniques that can help relieve some of the discomfort of labor and encourage your baby to rotate toward the best position for birth. You and your partner will be able to try a variety of labor positions with birth balls and rebozos as well as practice breathing, relaxation, and visualization techniques. A tour of the [facility] is included with this class. $20 per registrant and support person Childbirth Class- Weekend Option: This class is a Weekend version of our Birth & Baby series. It is designed for parents who have a difficult time fitting several weeks of classes into their schedule. It covers the care of your newborn and the basics of labor and childbirth. It also includes a [place] [facility] and lunch. The class is held two consecutive days: beginning on Friday evening from 6:30 - 8:30 p.m. and the next day, Saturday from 9 a.m. -  4 p.m. (call for fee) Waterbirth Class: Interested in a waterbirth?  This informational class will help you discover whether waterbirth is the right fit for you. Education about waterbirth  itself, supplies you would need and how to assemble your support team is what you can expect from this class. Some obstetrical practices require this class in order to pursue a waterbirth. (Not all obstetrical practices offer waterbirth-check with your healthcare provider.) Register only the expectant mom, but you are encouraged to bring your partner to class! Required if planning waterbirth, no fee. Infant/Child CPR: Parents, grandparents, babysitters, and friends learn Cardio-Pulmonary Resuscitation skills for infants and children. You will also learn how to treat both conscious and unconscious choking in infants and children. This Family & Friends program does not offer certification. Register each participant individually to ensure that enough mannequins are available. (Call for fee) Grandparent Love: Expecting a grandbaby? This class is for you! Learn about the latest infant care and safety recommendations and ways to support your own child as he or she transitions into the parenting role. Taught by Registered Nurses who are childbirth instructors, but most importantly...they are grandmothers too! $10/person. Childbirth Class- Natural Childbirth: This series of 5 weekly classes is for expectant parents who want to learn and practice natural methods of coping with the process of labor and childbirth. Relaxation, breathing, massage, visualization, role of the partner, and helpful positioning are highlighted. Participants learn how to be confident in their body's ability to give birth. This class will empower and help parents make informed decisions about their own care. Includes discussion that will help new parents transition into the immediate postpartum period. [facility] is included. We suggest taking this class between 25-32 weeks, but it's only a recommendation. $75 per registrant and one support person or $30 Medicaid. Childbirth Class- 3 week Series: This option of  3 weekly classes helps you and your labor partner prepare for childbirth. Newborn care, labor & birth, cesarean birth, pain management, and comfort techniques are discussed and a [place] [facility] is included. The class meets at the same time, on the same day of the week for 3 consecutive weeks beginning with the starting date you choose. $60 for registrant and one support person.  Marvelous Multiples: Expecting twins, triplets, or more? This class covers the differences in labor, birth, parenting, and breastfeeding issues that face multiples' parents. NICU tour is included. Led by a Certified Childbirth Educator who is the mother of twins. No fee. Caring for Baby: This class is for expectant and adoptive parents who want to learn and practice the most up-to-date newborn care for their babies. Focus is on birth through the first six weeks of life. Topics include feeding, bathing, diapering, crying, umbilical cord care, circumcision care and safe sleep. Parents learn to recognize symptoms of illness and when to call the pediatrician. Register only the mom-to-be and your partner or support person can plan to come with you! $10 per registrant and support person Childbirth Class- online option: This online class offers you the freedom to complete a Birth and Baby series in the comfort of your own home. The flexibility of this option allows you to review sections at your own pace, at times convenient to you and your support people. It includes additional video information, animations, quizzes, and extended activities. Get organized with helpful eClass tools, checklists, and trackers. Once you register online for the class, you will receive an email within a  few days to accept the invitation and begin the class when the time is right for you. The content will be available to you for 60 days. $60 for 60 days of online access for you and your support people.  Local Doulas: Natural Baby  Doulas [email or web address] Tel: [phone] [email or web address] [employer] [phone] [email or web address] The Labor Hassell Halim  (also do waterbirth tub rental) [phone] [email or web address] [email or web address] Doula [phone] [email or web address] [facility] Rhythms  [phone] [email or web address] [employer] (PADA) [email or web address] [place] Birth and Baby  [email or web address] Considering Waterbirth? Guide for patients at [facility] [employer]  Why consider waterbirth?  . Gentle birth for babies . Less pain medicine used in labor . May allow for passive descent/less pushing . May reduce perineal tears  . More mobility and instinctive maternal position changes . Increased maternal relaxation . Reduced blood pressure in labor  Is waterbirth safe? What are the risks of infection, drowning or other complications?  . Infection: o Very low risk (3.7 % for tub vs 4.8% for bed) o 7 in 8000 waterbirths with documented infection o Poorly cleaned equipment most common cause o Slightly lower group B strep transmission rate  . Drowning o Maternal:  - Very low risk   - Related to seizures or fainting o Newborn:  - Very low risk. No evidence of increased risk of respiratory problems in multiple large studies - Physiological protection from breathing under water - Avoid underwater birth if there are any fetal complications - Once baby's head is out of the water, keep it out.  . Birth complication o Some reports of cord trauma, but risk decreased by bringing baby to surface gradually o No evidence of increased risk of shoulder dystocia. Mothers can usually change positions faster in water than in a bed, possibly aiding the maneuvers to free the shoulder.   You  must attend a Doren Custard class at [facility]  3rd Wednesday of every month from 7-9pm  [employer] by calling [phone] or online at [email or web address]  Bring [place] the certificate from the class to your prenatal appointment  Meet with a midwife at 36 weeks to see if you can still plan a waterbirth and to sign the consent.   Purchase or rent the following supplies:   Water Birth Pool (Birth Pool in a Box or [place] for instance)  (Tubs start ~$125)  Single-use disposable tub liner designed for your brand of tub  New garden hose labeled "lead-free", "suitable for drinking water",  Electric drain pump to remove water (We recommend 792 gallon per hour or greater pump.)   Separate garden hose to remove the dirty water  Fish net  Bathing suit top (optional)  Long-handled mirror (optional)  Places to purchase or rent supplies  [email or web address] for tub purchases and supplies  Waterbirthsolutions.com for tub purchases and supplies  The Labor Ladies ([email or web address]) $275 for tub rental/set-up & take down/kit   [employer] (http://www.[place]ming.[email or web address]) Information regarding doulas (labor support) who provide pool rentals  Our practice has a Birth Pool in a Box tub at the hospital that you may borrow on a first-come-first-served basis. It is your responsibility to to set up, clean and break down the tub. We cannot guarantee the availability of this tub in advance. You are responsible for bringing all accessories listed above. If you do not have all necessary supplies you cannot have a waterbirth.    Things that would prevent you from having a waterbirth:  Premature, <37wks  Previous cesarean birth  Presence of thick meconium-stained fluid  Multiple gestation (Twins, triplets, etc.)  Uncontrolled diabetes or gestational diabetes requiring medication  Hypertension requiring medication or diagnosis of pre-eclampsia  Heavy  vaginal bleeding  Non-reassuring fetal heart rate  Active infection (MRSA, etc.). Group B Strep is NOT a contraindication for  waterbirth.  If your labor has to be induced and induction method requires continuous  monitoring of the baby's heart rate  Other risks/issues identified by your obstetrical provider  Please remember that birth is unpredictable. Under certain unforeseeable circumstances your provider may advise against giving birth in the tub. These decisions will be made on a case-by-case basis and with the safety of you and your baby as our highest priority.      Intrauterine Device Information An intrauterine device (IUD) is a medical device that is inserted in the uterus to prevent pregnancy. It is a small, T-shaped device that has one or two nylon strings hanging down from it. The strings hang out of the lower part of the uterus (cervix) to allow for future IUD removal. There are two types of IUDs available:  Hormone IUD. This type of IUD is made of plastic and contains the hormone progestin (synthetic progesterone). A hormone IUD may last 3-5 years.  Copper IUD. This type of IUD has copper wire wrapped around it. A copper IUD may last up to 10 years. How is an IUD inserted? An IUD is inserted through the vagina and placed into the uterus with a minor medical procedure. The exact procedure for IUD insertion may vary among health care providers and hospitals. How does an IUD work? Synthetic progesterone in a hormonal IUD prevents pregnancy by:  Thickening cervical mucus to prevent sperm from entering the uterus.  Thinning the uterine lining to prevent a fertilized egg from being implanted there. Copper in a copper IUD prevents pregnancy by making the uterus and fallopian tubes produce a fluid that kills sperm. What are the advantages of an IUD? Advantages of either type of IUD  It is highly effective in preventing pregnancy.  It is reversible. You can become pregnant  shortly after the IUD is removed.  It is low-maintenance and can stay in place for a long time.  There are no estrogen-related side effects.  It can be used when breastfeeding.  It is not associated with weight gain.  It can be inserted right after childbirth, an abortion, or a miscarriage. Advantages of a hormone IUD  If it is inserted within 7 days of your period starting, it works right after it is inserted. If the hormone IUD is inserted at any other time in your cycle, you will need to use a backup method of birth control for 7 days after insertion.  It can make menstrual periods lighter.  It can reduce menstrual cramping.  It can be used for 3-5 years. Advantages of a copper IUD  It works right after it is inserted.  It can be used as a form of emergency birth control if it is inserted within 5 days after having unprotected sex.  It does not interfere with your body's natural hormones.  It can be used for 10 years. What are the disadvantages of an IUD?  An IUD may cause irregular menstrual bleeding for a period of time after insertion.  You may have pain during insertion and have cramping and vaginal bleeding after insertion.  An IUD may cut the uterus (uterine perforation) when it is inserted. This is  rare.  An IUD may cause pelvic inflammatory disease (PID), which is an infection in the uterus and fallopian tubes. This is rare, and it usually happens during the first 20 days after the IUD is inserted.  A copper IUD can make your menstrual flow heavier and more painful. How is an IUD removed?  You will lie on your back with your knees bent and your feet in footrests (stirrups).  A device will be inserted into your vagina to spread apart the vaginal walls (speculum). This will allow your health care provider to see the strings attached to the IUD.  Your health care provider will use a small instrument (forceps) to grasp the IUD strings and pull firmly until the IUD  is removed. You may have some discomfort when the IUD is removed. Your health care provider may recommend taking over-the-counter pain relievers, such as ibuprofen, before the procedure. You may also have minor spotting for a few days after the procedure. The exact procedure for IUD removal may vary among health care providers and hospitals. Is the IUD right for me? Your health care provider will make sure you are a good candidate for an IUD and will discuss the advantages, disadvantages, and possible side effects with you. Summary  An intrauterine device (IUD) is a medical device that is inserted in the uterus to prevent pregnancy. It is a small, T-shaped device that has one or two nylon strings hanging down from it.  A hormone IUD contains the hormone progestin (synthetic progesterone). A copper IUD has copper wire wrapped around it.  Synthetic progesterone in a hormone IUD prevents pregnancy by thickening cervical mucus and thinning the walls of the uterus. Copper in a copper IUD prevents pregnancy by making the uterus and fallopian tubes produce a fluid that kills sperm.  A hormone IUD can be left in place for 3-5 years. A copper IUD can be left in place for up to 10 years.  An IUD is inserted and removed by a health care provider. You may feel some pain during insertion and removal. Your health care provider may recommend taking over-the-counter pain medicine, such as ibuprofen, before an IUD procedure. This information is not intended to replace advice given to you by your health care provider. Make sure you discuss any questions you have with your health care provider. Document Revised: 03/19/2017 Document Reviewed: 05/05/2016 Elsevier Patient Education  [place].   Signs and Symptoms of Labor Labor is your body's natural process of moving your baby, placenta, and umbilical cord out of your uterus. The process of labor usually starts when your baby is full-term, between 41 and  40 weeks of pregnancy. How will I know when I am close to going into labor? As your body prepares for labor and the birth of your baby, you may notice the following symptoms in the weeks and days before true labor starts:  Having a strong desire to get your home ready to receive your new baby. This is called nesting. Nesting may be a sign that labor is approaching, and it may occur several weeks before birth. Nesting may involve cleaning and organizing your home.  Passing a small amount of thick, bloody mucus out of your vagina (normal bloody show or losing your mucus plug). This may happen more than a week before labor begins, or it might occur right before labor begins as the opening of the cervix starts to widen (dilate). For some women, the entire mucus plug passes at once.  For others, smaller portions of the mucus plug may gradually pass over several days.  Your baby moving (dropping) lower in your pelvis to get into position for birth (lightening). When this happens, you may feel more pressure on your bladder and pelvic bone and less pressure on your ribs. This may make it easier to breathe. It may also cause you to need to urinate more often and have problems with bowel movements.  Having "practice contractions" (Braxton Hicks contractions) that occur at irregular (unevenly spaced) intervals that are more than 10 minutes apart. This is also called false labor. False labor contractions are common after exercise or sexual activity, and they will stop if you change position, rest, or drink fluids. These contractions are usually mild and do not get stronger over time. They may feel like: ? A backache or back pain. ? Mild cramps, similar to menstrual cramps. ? Tightening or pressure in your abdomen. Other early symptoms that labor may be starting soon include:  Nausea or loss of appetite.  Diarrhea.  Having a sudden burst of energy, or feeling very tired.  Mood changes.  Having trouble  sleeping. How will I know when labor has begun? Signs that true labor has begun may include:  Having contractions that come at regular (evenly spaced) intervals and increase in intensity. This may feel like more intense tightening or pressure in your abdomen that moves to your back. ? Contractions may also feel like rhythmic pain in your upper thighs or back that comes and goes at regular intervals. ? For first-time mothers, this change in intensity of contractions often occurs at a more gradual pace. ? Women who have given birth before may notice a more rapid progression of contraction changes.  Having a feeling of pressure in the vaginal area.  Your water breaking (rupture of membranes). This is when the sac of fluid that surrounds your baby breaks. When this happens, you will notice fluid leaking from your vagina. This may be clear or blood-tinged. Labor usually starts within 24 hours of your water breaking, but it may take longer to begin. ? Some women notice this as a gush of fluid. ? Others notice that their underwear repeatedly becomes damp. Follow these instructions at home:   When labor starts, or if your water breaks, call your health care provider or nurse care line. Based on your situation, they will determine when you should go in for an exam.  When you are in early labor, you may be able to rest and manage symptoms at home. Some strategies to try at home include: ? Breathing and relaxation techniques. ? Taking a warm bath or shower. ? Listening to music. ? Using a heating pad on the lower back for pain. If you are directed to use heat:  Place a towel between your skin and the heat source.  Leave the heat on for 20-30 minutes.  Remove the heat if your skin turns bright red. This is especially important if you are unable to feel pain, heat, or cold. You may have a greater risk of getting burned. Get help right away if:  You have painful, regular contractions that are 5  minutes apart or less.  Labor starts before you are [redacted] weeks along in your pregnancy.  You have a fever.  You have a headache that does not go away.  You have bright red blood coming from your vagina.  You do not feel your baby moving.  You have a sudden onset of: ?  Severe headache with vision problems. ? Nausea, vomiting, or diarrhea. ? Chest pain or shortness of breath. These symptoms may be an emergency. If your health care provider recommends that you go to the hospital or birth center where you plan to deliver, do not drive yourself. Have someone else drive you, or call emergency services (911 in the [place] Summary  Labor is your body's natural process of moving your baby, placenta, and umbilical cord out of your uterus.  The process of labor usually starts when your baby is full-term, between 40 and 40 weeks of pregnancy.  When labor starts, or if your water breaks, call your health care provider or nurse care line. Based on your situation, they will determine when you should go in for an exam. This information is not intended to replace advice given to you by your health care provider. Make sure you discuss any questions you have with your health care provider. Document Revised: 01/04/2017 Document Reviewed: 09/11/2016 Elsevier Patient Education  [place].

## 2019-04-24 NOTE — Progress Notes (Signed)
    Subjective:  Christine Bailey is a 24 y.o. G1P0 at [redacted]w[redacted]d being seen today for ongoing prenatal care.  She is currently monitored for the following issues for this low-risk pregnancy and has Supervision of low-risk first pregnancy and Eczema on their problem list.  Patient reports wondering if she needed another ultrasound..  Contractions: Not present. Vag. Bleeding: None.  Movement: Present. Denies leaking of fluid.   The following portions of the patient's history were reviewed and updated as appropriate: allergies, current medications, past family history, past medical history, past social history, past surgical history and problem list. Problem list updated.  Objective:   Vitals:   04/24/19 1313  BP: 125/77  Pulse: 96  Weight: 197 lb 6.4 oz (89.5 kg)    Fetal Status: Fetal Heart Rate (bpm): 145 Fundal Height: 33 cm Movement: Present  Presentation: Undeterminable  General:  Alert, oriented and cooperative. Patient is in no acute distress.  Skin: Skin is warm and dry. No rash noted.   Cardiovascular: Normal heart rate noted  Respiratory: Normal respiratory effort, no problems with respiration noted  Abdomen: Soft, gravid, appropriate for gestational age. Pain/Pressure: Absent     Pelvic:  Cervical exam performed GC/Chlam and GBS done Dilation: Closed Effacement (%): Thick Station: -3  Extremities: Normal range of motion.  Edema: Trace  Mental Status: Normal mood and affect. Normal behavior. Normal judgment and thought content.   Urinalysis:      Assessment and Plan:  Pregnancy: G1P0 at [redacted]w[redacted]d  1. Encounter for supervision of low-risk first pregnancy in third trimester Still does not have BP cuff.  RN helping to investigate and see what is happening.  Reviewed instructions to enter BP into Babyscripts weekly. Advised to check into online classes for childbirth and breastfeeding. Advised to get exercise ball if able and showed how to do some stretching on the ball. Reports baby is  moving well. Brief discussion of IUD as clietn does not want to be pregnant again but has not used birth control before.  - Culture, beta strep (group b only) - GC/Chlamydia probe amp ([place])not at [facility]  2. Uterine size date discrepancy pregnancy Measuring less than dates and will get [place].  - [place] MFM OB FOLLOW UP; Future  Preterm labor symptoms and general obstetric precautions including but not limited to vaginal bleeding, contractions, leaking of fluid and fetal movement were reviewed in detail with the patient. Please refer to After Visit Summary for other counseling recommendations.  Return in about 1 week (around 05/01/2019) for virtuall visit.  Nolene Bernheim, RN, MSN, NP-BC Nurse Practitioner, [facility] for [employer], [facility] 04/24/2019 1:46 PM

## 2019-04-25 ENCOUNTER — Encounter: Payer: Self-pay | Admitting: Nurse Practitioner

## 2019-04-25 DIAGNOSIS — A749 Chlamydial infection, unspecified: Secondary | ICD-10-CM | POA: Insufficient documentation

## 2019-04-25 DIAGNOSIS — O98813 Other maternal infectious and parasitic diseases complicating pregnancy, third trimester: Secondary | ICD-10-CM | POA: Insufficient documentation

## 2019-04-25 LAB — GC/CHLAMYDIA PROBE AMP (~~LOC~~) NOT AT ARMC
Chlamydia: POSITIVE — AB
Comment: NEGATIVE
Comment: NORMAL
Neisseria Gonorrhea: NEGATIVE

## 2019-04-25 MED ORDER — AZITHROMYCIN 250 MG PO TABS: 1000.0000 mg | ORAL_TABLET | Freq: Once | ORAL | 0 refills | Status: AC

## 2019-04-25 NOTE — Addendum Note (Signed)
Addended by: Currie Paris on: 04/25/2019 04:24 PM   Modules accepted: Orders

## 2019-04-26 NOTE — Progress Notes (Signed)
Pt notified via MyChart.    Addison Naegeli, RN

## 2019-04-27 ENCOUNTER — Encounter (HOSPITAL_COMMUNITY): Payer: Self-pay

## 2019-04-27 ENCOUNTER — Other Ambulatory Visit: Payer: Self-pay

## 2019-04-27 ENCOUNTER — Ambulatory Visit (HOSPITAL_COMMUNITY)
Admission: RE | Admit: 2019-04-27 | Discharge: 2019-04-27 | Disposition: A | Discharge status: Home / Self Care | Payer: Medicaid Other | Source: Ambulatory Visit | Attending: Obstetrics and Gynecology | Admitting: Obstetrics and Gynecology

## 2019-04-27 ENCOUNTER — Encounter: Payer: Self-pay | Admitting: Nurse Practitioner

## 2019-04-27 ENCOUNTER — Ambulatory Visit (HOSPITAL_COMMUNITY): Payer: Medicaid Other | Admitting: *Deleted

## 2019-04-27 DIAGNOSIS — Z362 Encounter for other antenatal screening follow-up: Secondary | ICD-10-CM

## 2019-04-27 DIAGNOSIS — Z3A36 36 weeks gestation of pregnancy: Secondary | ICD-10-CM

## 2019-04-27 DIAGNOSIS — O26849 Uterine size-date discrepancy, unspecified trimester: Secondary | ICD-10-CM | POA: Diagnosis not present

## 2019-04-27 DIAGNOSIS — O99820 Streptococcus B carrier state complicating pregnancy: Secondary | ICD-10-CM | POA: Insufficient documentation

## 2019-04-27 DIAGNOSIS — O26843 Uterine size-date discrepancy, third trimester: Secondary | ICD-10-CM | POA: Diagnosis not present

## 2019-04-27 DIAGNOSIS — Z3403 Encounter for supervision of normal first pregnancy, third trimester: Secondary | ICD-10-CM

## 2019-04-27 LAB — CULTURE, BETA STREP (GROUP B ONLY): Strep Gp B Culture: POSITIVE — AB

## 2019-05-01 ENCOUNTER — Telehealth (INDEPENDENT_AMBULATORY_CARE_PROVIDER_SITE_OTHER): Payer: Medicaid Other | Admitting: Student

## 2019-05-01 DIAGNOSIS — Z3403 Encounter for supervision of normal first pregnancy, third trimester: Secondary | ICD-10-CM

## 2019-05-01 DIAGNOSIS — Z3A37 37 weeks gestation of pregnancy: Secondary | ICD-10-CM

## 2019-05-01 NOTE — Progress Notes (Signed)
I connected with  Christine Bailey on 05/01/19 at 10:55 AM EST by telephone and verified that I am speaking with the correct person using two identifiers.   I discussed the limitations, risks, security and privacy concerns of performing an evaluation and management service by telephone and the availability of in person appointments. I also discussed with the patient that there may be a patient responsible charge related to this service. The patient expressed understanding and agreed to proceed.  Janene Madeira Rosalynd Mcwright, CMA 05/01/2019  11:03 AM

## 2019-05-01 NOTE — Progress Notes (Signed)
TELEHEALTH OBSTETRICS PRENATAL VIRTUAL VIDEO VISIT ENCOUNTER NOTE  Provider location: [facility] [place]   I connected with Christine Bailey on 05/01/19 at 10:55 AM EST by MyChart Video Encounter at home and verified that I am speaking with the correct person using two identifiers.   I discussed the limitations, risks, security and privacy concerns of performing an evaluation and management service virtually and the availability of in person appointments. I also discussed with the patient that there may be a patient responsible charge related to this service. The patient expressed understanding and agreed to proceed. Subjective:  Christine Bailey is a 24 y.o. G1P0 at [redacted]w[redacted]d being seen today for ongoing prenatal care.  She is currently monitored for the following issues for this low-risk pregnancy and has Supervision of low-risk first pregnancy; Eczema; Chlamydia infection affecting pregnancy in third trimester; and Group B Streptococcus carrier, +RV culture, currently pregnant on their problem list.  Patient reports no complaints.  Contractions: Not present. Vag. Bleeding: None.  Movement: Present. Denies any leaking of fluid.   The following portions of the patient's history were reviewed and updated as appropriate: allergies, current medications, past family history, past medical history, past social history, past surgical history and problem list.   Objective:   Vitals:   05/01/19 1100  BP: 123/63  Pulse: 89    Fetal Status:     Movement: Present     General:  Alert, oriented and cooperative. Patient is in no acute distress.  Respiratory: Normal respiratory effort, no problems with respiration noted  Mental Status: Normal mood and affect. Normal behavior. Normal judgment and thought content.  Rest of physical exam deferred due to type of encounter  Imaging: [place] MFM OB FOLLOW UP  Result Date: 04/27/2019  ----------------------------------------------------------------------  OBSTETRICS REPORT                       (Signed Final 04/27/2019 02:56 pm) ---------------------------------------------------------------------- Patient Info  ID #:       [id number]                          D.O.B.:  03-Nov-1995 (23 yrs)  Name:       Christine Bailey                    Visit Date: 04/27/2019 02:08 pm ---------------------------------------------------------------------- Performed By  Performed By:     Lenise Arena        Ref. Address:     [address]                    [place]                                                             [address]  Attending:        Ma Rings MD         [facility]  Referred By:      [facility] ---------------------------------------------------------------------- Orders   #  Description                          Code         Ordered By   1  [place] MFM OB FOLLOW UP                  35573.22     Nolene Bernheim  ----------------------------------------------------------------------   #  Order #                    Accession #                 Episode #   1  [id number]                  [id number]                  [id number]  ---------------------------------------------------------------------- Indications   Uterine size-date discrepancy, third trimester O26.843   Encounter for antenatal screening for          Z36.3   malformations   Genetic carrier (SMA)                          Z14.8   [redacted] weeks gestation of pregnancy                Z3A.36  ---------------------------------------------------------------------- Vital Signs  Weight (lb): 197                               Height:        5'7"  BMI:         30.85 ---------------------------------------------------------------------- Fetal Evaluation  Num Of Fetuses:         1  Cardiac Activity:       Observed  Presentation:           Cephalic  Placenta:                Posterior  P. Cord Insertion:      Previously Visualized  Amniotic Fluid  AFI FV:      Within normal limits  AFI Sum(cm)     %Tile       Largest Pocket(cm)  13.9            51          4.44  RUQ(cm)       RLQ(cm)       LUQ(cm)        LLQ(cm)  3.11          4.17          4.44           2.18 ---------------------------------------------------------------------- Biometry  BPD:        89  mm     G. Age:  36w 0d         43  %    CI:        79.86   %    70 - 86  FL/HC:      21.7   %    20.8 - 22.6  HC:      314.7  mm     G. Age:  35w 2d          4  %    HC/AC:      0.98        0.92 - 1.05  AC:      321.2  mm     G. Age:  36w 0d         43  %    FL/BPD:     76.7   %    71 - 87  FL:       68.3  mm     G. Age:  35w 1d         12  %    FL/AC:      21.3   %    20 - 24  LV:        2.2  mm  Est. FW:    2747  gm      6 lb 1 oz     28  % ---------------------------------------------------------------------- OB History  Gravidity:    1         Term:   0        Prem:   0        SAB:   0  TOP:          0       Ectopic:  0        Living: 0 ---------------------------------------------------------------------- Gestational Age  LMP:           36w 5d        Date:  08/13/18                 EDD:   05/20/19  U/S Today:     35w 4d                                        EDD:   05/28/19  Best:          36w 5d     Det. By:  U/S  (12/23/18)          EDD:   05/20/19 ---------------------------------------------------------------------- Anatomy  Cranium:               Appears normal         Aortic Arch:            Previously seen  Cavum:                 Appears normal         Ductal Arch:            Previously seen  Ventricles:            Appears normal         Diaphragm:              Appears normal  Choroid Plexus:        Previously seen        Stomach:                Appears normal, left  sided  Cerebellum:             Previously seen        Abdomen:                Appears normal  Posterior Fossa:       Previously seen        Abdominal Wall:         Previously seen  Nuchal Fold:           Previously seen        Cord Vessels:           Previously seen  Face:                  Orbits and profile     Kidneys:                Appear normal                         previously seen  Lips:                  Previously seen        Bladder:                Appears normal  Thoracic:              Appears normal         Spine:                  Previously seen  Heart:                 Previously seen        Upper Extremities:      Previously seen  RVOT:                  Previously seen        Lower Extremities:      Previously seen  LVOT:                  Previously seen  Other:  Nasal bone visualized. Heels and 5th digit visualized. 3VV  visualized.          Female gender ---------------------------------------------------------------------- Cervix Uterus Adnexa  Cervix  Not visualized (advanced GA >24wks) ---------------------------------------------------------------------- Comments  This patient was seen for a follow up growth scan as her  fundal heights have been measuring less than her dates.  She denies any problems in her current pregnancy and  reports that she has screened negative for gestational  diabetes.  She was informed that the fetal growth and amniotic fluid  level appears appropriate for her gestational age.  No further exams were scheduled in our office. ----------------------------------------------------------------------                   Johnell Comings, MD Electronically Signed Final Report   04/27/2019 02:56 pm ----------------------------------------------------------------------   Assessment and Plan:  Pregnancy: G1P0 at [redacted]w[redacted]d 1. Encounter for supervision of low-risk first pregnancy in third trimester -reviewed previous labs. Her & her partner have both taken chlamydia treatment & have not had intercourse since then.  Discussed TOC at next in person visit -reviewed GBS positive. Denies medication allergies. Will receive antibiotics during labor -Discussed upcoming visits. Will plan for in person visit at 40 wks with antenatal testing. Will plan to set up 41 wk induction at that time  Term labor symptoms and general obstetric precautions including  but not limited to vaginal bleeding, contractions, leaking of fluid and fetal movement were reviewed in detail with the patient. I discussed the assessment and treatment plan with the patient. The patient was provided an opportunity to ask questions and all were answered. The patient agreed with the plan and demonstrated an understanding of the instructions. The patient was advised to call back or seek an in-person office evaluation/go to MAU at [facility] for any urgent or concerning symptoms. Please refer to After Visit Summary for other counseling recommendations.   I provided 8 minutes of face-to-face time during this encounter.  No follow-ups on file.  Future Appointments  Date Time Provider Department Center  05/08/2019 11:15 AM Currie Paris, NP [facility]  05/16/2019 11:15 AM [name], CNM [facility]    Judeth Horn, NP Center for [employer], [facility]

## 2019-05-01 NOTE — Patient Instructions (Signed)
The Maternity Assessment Unit (MAU) is located at the [facility] and [place] at [facility]. The address is: [address] [place], [place], [place] [address]. Please see map below for additional directions.    The Maternity Assessment Unit is designed to help you during your pregnancy, and for up to 6 weeks after delivery, with any pregnancy- or postpartum-related emergencies, if you think you are in labor, or if your water has broken. For example, if you experience nausea and vomiting, vaginal bleeding, severe abdominal or pelvic pain, elevated blood pressure or other problems related to your pregnancy or postpartum time, please come to the Maternity Assessment Unit for assistance.     Signs and Symptoms of Labor Labor is your body's natural process of moving your baby, placenta, and umbilical cord out of your uterus. The process of labor usually starts when your baby is full-term, between 87 and 40 weeks of pregnancy. How will I know when I am close to going into labor? As your body prepares for labor and the birth of your baby, you may notice the following symptoms in the weeks and days before true labor starts:  Having a strong desire to get your home ready to receive your new baby. This is called nesting. Nesting may be a sign that labor is approaching, and it may occur several weeks before birth. Nesting may involve cleaning and organizing your home.  Passing a small amount of thick, bloody mucus out of your vagina (normal bloody show or losing your mucus plug). This may happen more than a week before labor begins, or it might occur right before labor begins as the opening of the cervix starts to widen (dilate). For some women, the entire mucus plug passes at once. For others, smaller portions of the mucus plug may gradually pass over several days.  Your baby moving (dropping) lower in your pelvis to get into position for birth (lightening). When this happens, you may  feel more pressure on your bladder and pelvic bone and less pressure on your ribs. This may make it easier to breathe. It may also cause you to need to urinate more often and have problems with bowel movements.  Having "practice contractions" (Braxton Hicks contractions) that occur at irregular (unevenly spaced) intervals that are more than 10 minutes apart. This is also called false labor. False labor contractions are common after exercise or sexual activity, and they will stop if you change position, rest, or drink fluids. These contractions are usually mild and do not get stronger over time. They may feel like: ? A backache or back pain. ? Mild cramps, similar to menstrual cramps. ? Tightening or pressure in your abdomen. Other early symptoms that labor may be starting soon include:  Nausea or loss of appetite.  Diarrhea.  Having a sudden burst of energy, or feeling very tired.  Mood changes.  Having trouble sleeping. How will I know when labor has begun? Signs that true labor has begun may include:  Having contractions that come at regular (evenly spaced) intervals and increase in intensity. This may feel like more intense tightening or pressure in your abdomen that moves to your back. ? Contractions may also feel like rhythmic pain in your upper thighs or back that comes and goes at regular intervals. ? For first-time mothers, this change in intensity of contractions often occurs at a more gradual pace. ? Women who have given birth before may notice a more rapid progression of contraction changes.  Having a feeling of pressure in the vaginal area.  Your water breaking (rupture of membranes). This is when the sac of fluid that surrounds your baby breaks. When this happens, you will notice fluid leaking from your vagina. This may be clear or blood-tinged. Labor usually starts within 24 hours of your water breaking, but it may take longer to begin. ? Some women notice this as a gush of  fluid. ? Others notice that their underwear repeatedly becomes damp. Follow these instructions at home:   When labor starts, or if your water breaks, call your health care provider or nurse care line. Based on your situation, they will determine when you should go in for an exam.  When you are in early labor, you may be able to rest and manage symptoms at home. Some strategies to try at home include: ? Breathing and relaxation techniques. ? Taking a warm bath or shower. ? Listening to music. ? Using a heating pad on the lower back for pain. If you are directed to use heat:  Place a towel between your skin and the heat source.  Leave the heat on for 20-30 minutes.  Remove the heat if your skin turns bright red. This is especially important if you are unable to feel pain, heat, or cold. You may have a greater risk of getting burned. Get help right away if:  You have painful, regular contractions that are 5 minutes apart or less.  Labor starts before you are [redacted] weeks along in your pregnancy.  You have a fever.  You have a headache that does not go away.  You have bright red blood coming from your vagina.  You do not feel your baby moving.  You have a sudden onset of: ? Severe headache with vision problems. ? Nausea, vomiting, or diarrhea. ? Chest pain or shortness of breath. These symptoms may be an emergency. If your health care provider recommends that you go to the hospital or birth center where you plan to deliver, do not drive yourself. Have someone else drive you, or call emergency services (911 in the [place] Summary  Labor is your body's natural process of moving your baby, placenta, and umbilical cord out of your uterus.  The process of labor usually starts when your baby is full-term, between 73 and 40 weeks of pregnancy.  When labor starts, or if your water breaks, call your health care provider or nurse care line. Based on your situation, they will determine when you  should go in for an exam. This information is not intended to replace advice given to you by your health care provider. Make sure you discuss any questions you have with your health care provider. Document Revised: 01/04/2017 Document Reviewed: 09/11/2016 Elsevier Patient Education  2020 [employer] Inc[employer]     Fetal Movement Counts Patient Name: ________________________________________________ Patient Due Date: ____________________ What is a fetal movement count?  A fetal movement count is the number of times that you feel your baby move during a certain amount of time. This may also be called a fetal kick count. A fetal movement count is recommended for every pregnant woman. You may be asked to start counting fetal movements as early as week 28 of your pregnancy. Pay attention to when your baby is most active. You may notice your baby's sleep and wake cycles. You may also notice things that make your baby move more. You should do a fetal movement count:  When your baby is normally most active.  At the same time each day. A good time to count movements is while you are resting, after having something to eat and drink. How do I count fetal movements? 1. Find a quiet, comfortable area. Sit, or lie down on your side. 2. Write down the date, the start time and stop time, and the number of movements that you felt between those two times. Take this information with you to your health care visits. 3. Write down your start time when you feel the first movement. 4. Count kicks, flutters, swishes, rolls, and jabs. You should feel at least 10 movements. 5. You may stop counting after you have felt 10 movements, or if you have been counting for 2 hours. Write down the stop time. 6. If you do not feel 10 movements in 2 hours, contact your health care provider for further instructions. Your health care provider may want to do additional tests to assess your baby's well-being. Contact a health care provider  if:  You feel fewer than 10 movements in 2 hours.  Your baby is not moving like he or she usually does. Date: ____________ Start time: ____________ Stop time: ____________ Movements: ____________ Date: ____________ Start time: ____________ Stop time: ____________ Movements: ____________ Date: ____________ Start time: ____________ Stop time: ____________ Movements: ____________ Date: ____________ Start time: ____________ Stop time: ____________ Movements: ____________ Date: ____________ Start time: ____________ Stop time: ____________ Movements: ____________ Date: ____________ Start time: ____________ Stop time: ____________ Movements: ____________ Date: ____________ Start time: ____________ Stop time: ____________ Movements: ____________ Date: ____________ Start time: ____________ Stop time: ____________ Movements: ____________ Date: ____________ Start time: ____________ Stop time: ____________ Movements: ____________ This information is not intended to replace advice given to you by your health care provider. Make sure you discuss any questions you have with your health care provider. Document Revised: 11/24/2018 Document Reviewed: 11/24/2018 Elsevier Patient Education  2020 [employer].

## 2019-05-08 ENCOUNTER — Other Ambulatory Visit: Payer: Self-pay

## 2019-05-08 ENCOUNTER — Encounter: Payer: Self-pay | Admitting: Nurse Practitioner

## 2019-05-08 ENCOUNTER — Telehealth (INDEPENDENT_AMBULATORY_CARE_PROVIDER_SITE_OTHER): Payer: Medicaid Other | Admitting: Nurse Practitioner

## 2019-05-08 DIAGNOSIS — Z3A38 38 weeks gestation of pregnancy: Secondary | ICD-10-CM | POA: Diagnosis not present

## 2019-05-08 DIAGNOSIS — Z3403 Encounter for supervision of normal first pregnancy, third trimester: Secondary | ICD-10-CM | POA: Diagnosis not present

## 2019-05-08 NOTE — Progress Notes (Signed)
I connected with  [name] on 05/08/19 at 11:15 AM EST by telephone and verified that I am speaking with the correct person using two identifiers.   I discussed the limitations, risks, security and privacy concerns of performing an evaluation and management service by telephone and the availability of in person appointments. I also discussed with the patient that there may be a patient responsible charge related to this service. The patient expressed understanding and agreed to proceed.  Janene Madeira Cullin Dishman, CMA 05/08/2019  11:16 AM

## 2019-05-08 NOTE — Progress Notes (Signed)
I connected with@ on 05/08/19 at 11:15 AM EST by: MyChart and verified that I am speaking with the correct person using two identifiers.  Patient is located at home and provider is located at [name]     The purpose of this virtual visit is to provide medical care while limiting exposure to the novel coronavirus. I discussed the limitations, risks, security and privacy concerns of performing an evaluation and management service by MyChart and the availability of in person appointments. I also discussed with the patient that there may be a patient responsible charge related to this service. By engaging in this virtual visit, you consent to the provision of healthcare.  Additionally, you authorize for your insurance to be billed for the services provided during this visit.  The patient expressed understanding and agreed to proceed.  The following staff members participated in the virtual visit: Nolene Bernheim, NP and Virgina Evener, CMA    PRENATAL VISIT NOTE  Subjective:  Christine Bailey is a 24 y.o. G1P0 at [redacted]w[redacted]d  for phone visit for ongoing prenatal care.  She is currently monitored for the following issues for this low-risk pregnancy and has Supervision of low-risk first pregnancy; Eczema; Chlamydia infection affecting pregnancy in third trimester; and Group B Streptococcus carrier, +RV culture, currently pregnant on their problem list.  Patient reports a few contractions every day.  Contractions: Not present. Vag. Bleeding: None.  Movement: Present. Denies leaking of fluid.   The following portions of the patient's history were reviewed and updated as appropriate: allergies, current medications, past family history, past medical history, past social history, past surgical history and problem list.   Objective:   Vitals:   05/08/19 1115  BP: 136/82  Pulse: 91   Self-Obtained  Fetal Status:     Movement: Present     Assessment and Plan:  Pregnancy: G1P0 at [redacted]w[redacted]d 1. Encounter for  supervision of low-risk first pregnancy in third trimester Using exercise ball at home Has not seen any online videos about childbirth or breastfeeding. Reviewed skin to skin measures after birth. Reviewed next visits and will be scheduled for induction at 41 weeks.  Term labor symptoms and general obstetric precautions including but not limited to vaginal bleeding, contractions, leaking of fluid and fetal movement were reviewed in detail with the patient.  Return in about 1 week (around 05/15/2019) for already scheduled for appointment but will need to be in person visit for cervical exam.  Next appointment already scheduled with be in person for a cervical exam and TOC for chlamydia.  Treatment was given on 04-26-19 for her and her partner.  Did have intercourse on 05-06-19.  Future Appointments  Date Time Provider Department Center  05/16/2019 11:15 AM [name], CNM [facility]     Time spent on virtual visit: 10 minutes  Currie Paris, NP

## 2019-05-16 ENCOUNTER — Other Ambulatory Visit: Payer: Self-pay

## 2019-05-16 ENCOUNTER — Encounter: Payer: Self-pay | Admitting: Advanced Practice Midwife

## 2019-05-16 ENCOUNTER — Ambulatory Visit (INDEPENDENT_AMBULATORY_CARE_PROVIDER_SITE_OTHER): Payer: Medicaid Other | Admitting: Advanced Practice Midwife

## 2019-05-16 VITALS — BP 128/85 | HR 88 | Wt 198.0 lb

## 2019-05-16 DIAGNOSIS — Z3483 Encounter for supervision of other normal pregnancy, third trimester: Secondary | ICD-10-CM

## 2019-05-16 DIAGNOSIS — Z3480 Encounter for supervision of other normal pregnancy, unspecified trimester: Secondary | ICD-10-CM

## 2019-05-16 DIAGNOSIS — Z3A39 39 weeks gestation of pregnancy: Secondary | ICD-10-CM

## 2019-05-16 NOTE — Progress Notes (Signed)
   PRENATAL VISIT NOTE  Subjective:  Christine Bailey is a 24 y.o. G1P0 at [redacted]w[redacted]d being seen today for ongoing prenatal care.  She is currently monitored for the following issues for this low-risk pregnancy and has Supervision of low-risk first pregnancy; Eczema; Chlamydia infection affecting pregnancy in third trimester; and Group B Streptococcus carrier, +RV culture, currently pregnant on their problem list.  Patient reports no complaints.  Contractions: Not present. Vag. Bleeding: None.  Movement: Present. Denies leaking of fluid.   The following portions of the patient's history were reviewed and updated as appropriate: allergies, current medications, past family history, past medical history, past social history, past surgical history and problem list.   Objective:   Vitals:   05/16/19 1131  BP: 128/85  Pulse: 88  Weight: 198 lb (89.8 kg)    Fetal Status: Fetal Heart Rate (bpm): 140   Movement: Present     General:  Alert, oriented and cooperative. Patient is in no acute distress.  Skin: Skin is warm and dry. No rash noted.   Cardiovascular: Normal heart rate noted  Respiratory: Normal respiratory effort, no problems with respiration noted  Abdomen: Soft, gravid, appropriate for gestational age.  Pain/Pressure: Absent     Pelvic: Cervical exam performed Dilation: Closed Effacement (%): 50 Station: -2  Extremities: Normal range of motion.  Edema: Trace  Mental Status: Normal mood and affect. Normal behavior. Normal judgment and thought content.   Assessment and Plan:  Pregnancy: G1P0 at [redacted]w[redacted]d 1. Supervision of other normal pregnancy, antepartum - routine care - IOL for  - IOL orders placed - Will have patient come in Friday 05/26/2019 to MAU for FB placement.   Term labor symptoms and general obstetric precautions including but not limited to vaginal bleeding, contractions, leaking of fluid and fetal movement were reviewed in detail with the patient. Please refer to After Visit  Summary for other counseling recommendations.   Return in about 1 week (around 05/23/2019) for for post-dates BPP and NST .  Future Appointments  Date Time Provider Department Center  05/24/2019  9:15 AM [facility] NST [facility]  05/24/2019 10:35 AM Marny Lowenstein, PA-C [facility]  05/27/2019  7:30 AM [facility] SCHED ROOM [facility] None    Thressa Sheller DNP, CNM  05/16/19  12:12 PM

## 2019-05-21 ENCOUNTER — Other Ambulatory Visit: Payer: Self-pay

## 2019-05-21 ENCOUNTER — Other Ambulatory Visit: Payer: Self-pay | Admitting: Family Medicine

## 2019-05-21 ENCOUNTER — Inpatient Hospital Stay (HOSPITAL_COMMUNITY)
Admission: AD | Admit: 2019-05-21 | Discharge: 2019-05-23 | DRG: 807 | Disposition: A | Discharge status: Home / Self Care | Payer: Medicaid Other | Attending: Obstetrics & Gynecology | Admitting: Obstetrics & Gynecology

## 2019-05-21 ENCOUNTER — Encounter (HOSPITAL_COMMUNITY): Payer: Self-pay | Admitting: Obstetrics & Gynecology

## 2019-05-21 DIAGNOSIS — O480 Post-term pregnancy: Secondary | ICD-10-CM | POA: Diagnosis not present

## 2019-05-21 DIAGNOSIS — O99824 Streptococcus B carrier state complicating childbirth: Secondary | ICD-10-CM | POA: Diagnosis not present

## 2019-05-21 DIAGNOSIS — Z20822 Contact with and (suspected) exposure to covid-19: Secondary | ICD-10-CM | POA: Diagnosis present

## 2019-05-21 DIAGNOSIS — O99820 Streptococcus B carrier state complicating pregnancy: Secondary | ICD-10-CM

## 2019-05-21 DIAGNOSIS — A749 Chlamydial infection, unspecified: Secondary | ICD-10-CM | POA: Diagnosis present

## 2019-05-21 DIAGNOSIS — Z3A40 40 weeks gestation of pregnancy: Secondary | ICD-10-CM | POA: Diagnosis not present

## 2019-05-21 DIAGNOSIS — O368390 Maternal care for abnormalities of the fetal heart rate or rhythm, unspecified trimester, not applicable or unspecified: Secondary | ICD-10-CM

## 2019-05-21 DIAGNOSIS — O4693 Antepartum hemorrhage, unspecified, third trimester: Secondary | ICD-10-CM | POA: Diagnosis present

## 2019-05-21 DIAGNOSIS — Z3403 Encounter for supervision of normal first pregnancy, third trimester: Secondary | ICD-10-CM

## 2019-05-21 DIAGNOSIS — Z3400 Encounter for supervision of normal first pregnancy, unspecified trimester: Secondary | ICD-10-CM

## 2019-05-21 DIAGNOSIS — O134 Gestational [pregnancy-induced] hypertension without significant proteinuria, complicating childbirth: Secondary | ICD-10-CM | POA: Diagnosis not present

## 2019-05-21 LAB — URINALYSIS, ROUTINE W REFLEX MICROSCOPIC
Bacteria, UA: NONE SEEN
Bilirubin Urine: NEGATIVE
Glucose, UA: NEGATIVE mg/dL
Ketones, ur: NEGATIVE mg/dL
Leukocytes,Ua: NEGATIVE
Nitrite: NEGATIVE
Protein, ur: 30 mg/dL — AB
Specific Gravity, Urine: 1.021 (ref 1.005–1.030)
pH: 6 (ref 5.0–8.0)

## 2019-05-21 LAB — RESPIRATORY PANEL BY RT PCR (FLU A&B, COVID)
Influenza A by PCR: NEGATIVE
Influenza B by PCR: NEGATIVE
SARS Coronavirus 2 by RT PCR: NEGATIVE

## 2019-05-21 LAB — CBC
HCT: 33.5 % — ABNORMAL LOW (ref 36.0–46.0)
Hemoglobin: 11.7 g/dL — ABNORMAL LOW (ref 12.0–15.0)
MCH: 31.9 pg (ref 26.0–34.0)
MCHC: 34.9 g/dL (ref 30.0–36.0)
MCV: 91.3 fL (ref 80.0–100.0)
Platelets: 218 10*3/uL (ref 150–400)
RBC: 3.67 MIL/uL — ABNORMAL LOW (ref 3.87–5.11)
RDW: 13 % (ref 11.5–15.5)
WBC: 9.5 10*3/uL (ref 4.0–10.5)
nRBC: 0 % (ref 0.0–0.2)

## 2019-05-21 LAB — ABO/RH: ABO/RH(D): A POS

## 2019-05-21 LAB — TYPE AND SCREEN
ABO/RH(D): A POS
Antibody Screen: NEGATIVE

## 2019-05-21 MED ORDER — ONDANSETRON HCL 4 MG/2ML IJ SOLN
4.0000 mg | Freq: Four times a day (QID) | INTRAMUSCULAR | Status: DC | PRN
  Administered 2019-05-22: 02:00:00 4 mg via INTRAVENOUS
  Filled 2019-05-21: qty 2

## 2019-05-21 MED ORDER — TERBUTALINE SULFATE 1 MG/ML IJ SOLN: 0.2500 mg | Freq: Once | INTRAMUSCULAR | Status: DC | PRN

## 2019-05-21 MED ORDER — LACTATED RINGERS IV SOLN: INTRAVENOUS | Status: DC

## 2019-05-21 MED ORDER — PENICILLIN G POT IN DEXTROSE 60000 UNIT/ML IV SOLN
3.0000 10*6.[IU] | INTRAVENOUS | Status: DC
  Administered 2019-05-21 – 2019-05-22 (×2): 3 10*6.[IU] via INTRAVENOUS
  Filled 2019-05-21 (×2): qty 50

## 2019-05-21 MED ORDER — LACTATED RINGERS IV SOLN
500.0000 mL | INTRAVENOUS | Status: DC | PRN
  Administered 2019-05-21 – 2019-05-22 (×2): 500 mL via INTRAVENOUS

## 2019-05-21 MED ORDER — OXYTOCIN BOLUS FROM INFUSION
500.0000 mL | Freq: Once | INTRAVENOUS | Status: AC
  Administered 2019-05-22: 04:00:00 500 mL via INTRAVENOUS

## 2019-05-21 MED ORDER — SODIUM CHLORIDE 0.9 % IV SOLN
5.0000 10*6.[IU] | Freq: Once | INTRAVENOUS | Status: AC
  Administered 2019-05-21: 5 10*6.[IU] via INTRAVENOUS
  Filled 2019-05-21: qty 5

## 2019-05-21 MED ORDER — OXYTOCIN 40 UNITS IN NORMAL SALINE INFUSION - SIMPLE MED: 2.5000 [IU]/h | INTRAVENOUS | Status: DC

## 2019-05-21 MED ORDER — OXYTOCIN 40 UNITS IN NORMAL SALINE INFUSION - SIMPLE MED
1.0000 m[IU]/min | INTRAVENOUS | Status: DC
  Administered 2019-05-21: 2 m[IU]/min via INTRAVENOUS
  Filled 2019-05-21: qty 1000

## 2019-05-21 MED ORDER — SOD CITRATE-CITRIC ACID 500-334 MG/5ML PO SOLN: 30.0000 mL | ORAL | Status: DC | PRN

## 2019-05-21 MED ORDER — LIDOCAINE HCL (PF) 1 % IJ SOLN: 30.0000 mL | INTRAMUSCULAR | Status: DC | PRN

## 2019-05-21 MED ORDER — OXYCODONE-ACETAMINOPHEN 5-325 MG PO TABS: 1.0000 | ORAL_TABLET | ORAL | Status: DC | PRN

## 2019-05-21 MED ORDER — OXYCODONE-ACETAMINOPHEN 5-325 MG PO TABS: 2.0000 | ORAL_TABLET | ORAL | Status: DC | PRN

## 2019-05-21 MED ORDER — ACETAMINOPHEN 325 MG PO TABS: 650.0000 mg | ORAL_TABLET | ORAL | Status: DC | PRN

## 2019-05-21 NOTE — H&P (Signed)
OBSTETRIC ADMISSION HISTORY AND PHYSICAL  Christine Bailey is a 24 y.o. female G1P0 with IUP at 67w1dby LMP presenting for vaginal bleeding at term. She reports +FMs, No LOF, no VB, no blurry vision, headaches or peripheral edema, and RUQ pain.  She plans on breast feeding. She request POP or Nexplanon for birth control. She [facility]  Dating: By LMP --->  Estimated Date of Delivery: 05/20/19  Prenatal History/Complications:  Past Medical History: Past Medical History:  Diagnosis Date  . Eczema   . Medical history non-contributory     Past Surgical History: Past Surgical History:  Procedure Laterality Date  . NO PAST SURGERIES      Obstetrical History: OB History    Gravida  1   Para      Term      Preterm      AB      Living  0     SAB      TAB      Ectopic      Multiple      Live Births              Social History Social History   Socioeconomic History  . Marital status: Single    Spouse name: Not on file  . Number of children: Not on file  . Years of education: Not on file  . Highest education level: Not on file  Occupational History    Comment: Works @ [employer]  Tobacco Use  . Smoking status: Never Smoker  . Smokeless tobacco: Never Used  Substance and Sexual Activity  . Alcohol use: Not Currently    Comment: occasionaly - none since pregnancy  . Drug use: Not Currently    Types: Marijuana    Comment: last use 4 months ago  . Sexual activity: Yes    Birth control/protection: None  Other Topics Concern  . Not on file  Social History Narrative  . Not on file   Social Determinants of Health   Financial Resource Strain:   . Difficulty of Paying Living Expenses: Not on file  Food Insecurity: Food Insecurity Present  . Worried About RCharity fundraiserin the Last Year: Sometimes true  . Ran Out of Food in the Last Year: Sometimes true  Transportation Needs: No Transportation Needs  . Lack of Transportation  (Medical): No  . Lack of Transportation (Non-Medical): No  Physical Activity:   . Days of Exercise per Week: Not on file  . Minutes of Exercise per Session: Not on file  Stress:   . Feeling of Stress : Not on file  Social Connections:   . Frequency of Communication with Friends and Family: Not on file  . Frequency of Social Gatherings with Friends and Family: Not on file  . Attends Religious Services: Not on file  . Active Member of Clubs or Organizations: Not on file  . Attends CArchivistMeetings: Not on file  . Marital Status: Not on file    Family History: Family History  Problem Relation Age of Onset  . Cancer Mother        Breast    Allergies: No Known Allergies  Medications Prior to Admission  Medication Sig Dispense Refill Last Dose  . Blood Pressure Monitoring (BLOOD PRESSURE KIT) DEVI 1 Device by Does not apply route as needed. ICD 10:Z34.00 1 Device 0   . Prenatal Vit-Fe Fumarate-FA (PRENATAL VITAMIN) 27-0.8 MG TABS Take 1 tablet  by mouth daily. 30 tablet 12    Review of Systems   All systems reviewed and negative except as stated in HPI  Blood pressure 136/76, pulse 93, temperature 98.8 F (37.1 C), temperature source Oral, resp. rate 16, last menstrual period 08/13/2018. General appearance: alert, cooperative and no distress Lungs: clear to auscultation bilaterally Heart: regular rate and rhythm Abdomen: soft, non-tender; bowel sounds normal Pelvic: n/a Extremities: Homans sign is negative, no sign of DVT DTR's +2 Presentation: cephalic Fetal monitoringBaseline: 140 bpm, Variability: Good {> 6 bpm), Accelerations: Reactive and Decelerations: variable Uterine activityFrequency: Every 5 minutes Dilation: 2 Effacement (%): 80 Exam by:: Sharolyn Douglas CNM    Prenatal labs: ABO, Rh: A/Positive/-- (07/21 1137) Antibody: Negative (07/21 1137) Rubella: 1.06 (07/21 1137) RPR: Non Reactive (11/10 0907)  HBsAg: Negative (07/21 1137)  HIV: Non  Reactive (11/10 1324)  GBS: Positive/-- (01/04 1704)   Prenatal Transfer Tool  Maternal Diabetes: No Genetic Screening: Normal Maternal Ultrasounds/Referrals: Normal Fetal Ultrasounds or other Referrals:  None Maternal Substance Abuse:  No Significant Maternal Medications:  None Significant Maternal Lab Results: Group B Strep positive  Results for orders placed or performed during the hospital encounter of 05/21/19 (from the past 24 hour(s))  Urinalysis, Routine w reflex microscopic   Collection Time: 05/21/19  4:53 PM  Result Value Ref Range   Color, Urine YELLOW YELLOW   APPearance HAZY (A) CLEAR   Specific Gravity, Urine 1.021 1.005 - 1.030   pH 6.0 5.0 - 8.0   Glucose, UA NEGATIVE NEGATIVE mg/dL   Hgb urine dipstick MODERATE (A) NEGATIVE   Bilirubin Urine NEGATIVE NEGATIVE   Ketones, ur NEGATIVE NEGATIVE mg/dL   Protein, ur 30 (A) NEGATIVE mg/dL   Nitrite NEGATIVE NEGATIVE   Leukocytes,Ua NEGATIVE NEGATIVE   RBC / HPF 0-5 0 - 5 RBC/hpf   WBC, UA 0-5 0 - 5 WBC/hpf   Bacteria, UA NONE SEEN NONE SEEN   Squamous Epithelial / LPF 6-10 0 - 5   Mucus PRESENT     Patient Active Problem List   Diagnosis Date Noted  . Post-dates pregnancy 05/21/2019  . Group B Streptococcus carrier, +RV culture, currently pregnant 04/27/2019  . Chlamydia infection affecting pregnancy in third trimester 04/25/2019  . Supervision of low-risk first pregnancy 11/01/2018  . Eczema 11/01/2018    Assessment/Plan:  Christine Bailey is a 24 y.o. G1P0 at 16w1dhere for vaginal bleeding and FHR variables  #Labor: expectant management for now. Consider FB #Pain: Per patient request #FWB: Cat 1 #ID:  GBS pos, PCN #MOF: Breast #MOC:POP or Nexplanon #Circ:  nTrego CNM  05/21/2019, 5:21 PM

## 2019-05-21 NOTE — MAU Note (Signed)
Pt reports to mau with c/o vag bleeding.  Pt reports she noticed some brown mucous dc earlier today, but 30 mins prior to her coming to mau noticed bright red blood when she wiped. States she is not having to wear a pad.  Pt reports last intercourse was 2 days ago.  Pt denies any pain or LOF at this time.  Reports good fetal movement.

## 2019-05-21 NOTE — Progress Notes (Signed)
Labor Progress Note Christine Bailey is a 24 y.o. G1P0 at 40w1dpresented for possible vaginal bleeding and admitted for IOL for NRFHT.  S: Starting to feel ctx. Met patient and discussed plan.   O:  BP 139/85   Pulse 73   Temp 98.2 F (36.8 C) (Oral)   Resp 18   Ht '5\' 7"'$  (1.702 m)   Wt 89.8 kg   LMP 08/13/2018 (Within Days)   BMI 31.01 kg/m  EFM: 145, moderate variability, pos accels, no decels, reactive TOCO: q2-410mCVE: Dilation: 1.5 Effacement (%): 80 Cervical Position: Posterior Station: -2 Presentation: Vertex Exam by:: Dr. FaMarice Potter A&P: [age].o. G1P0 4088w1dme to MAU for possible vaginal bleeding and admitted for IOL for NRFHT (variables). #Labor: Progressing well. [place]ocin [place] Pitocin titration as able. Unchanged cervix thus far. Foley bulb placed. No vaginal bleeding noted with exam. Anticipate SVD.  #Pain: per patient request #FWB: Cat I #GBS positive; PCN #Chlamydia positive 04/23/2018: Treated on 1/6. Will order TOC.   CheChauncey MannD 8:08 PM

## 2019-05-22 ENCOUNTER — Inpatient Hospital Stay (HOSPITAL_COMMUNITY): Payer: Medicaid Other | Admitting: Anesthesiology

## 2019-05-22 ENCOUNTER — Encounter (HOSPITAL_COMMUNITY): Payer: Self-pay | Admitting: Family Medicine

## 2019-05-22 DIAGNOSIS — O99824 Streptococcus B carrier state complicating childbirth: Secondary | ICD-10-CM

## 2019-05-22 DIAGNOSIS — O368390 Maternal care for abnormalities of the fetal heart rate or rhythm, unspecified trimester, not applicable or unspecified: Secondary | ICD-10-CM

## 2019-05-22 DIAGNOSIS — O480 Post-term pregnancy: Secondary | ICD-10-CM

## 2019-05-22 DIAGNOSIS — Z3A40 40 weeks gestation of pregnancy: Secondary | ICD-10-CM

## 2019-05-22 LAB — COMPREHENSIVE METABOLIC PANEL
ALT: 14 U/L (ref 0–44)
AST: 21 U/L (ref 15–41)
Albumin: 3 g/dL — ABNORMAL LOW (ref 3.5–5.0)
Alkaline Phosphatase: 203 U/L — ABNORMAL HIGH (ref 38–126)
Anion gap: 8 (ref 5–15)
BUN: 7 mg/dL (ref 6–20)
CO2: 21 mmol/L — ABNORMAL LOW (ref 22–32)
Calcium: 9.3 mg/dL (ref 8.9–10.3)
Chloride: 107 mmol/L (ref 98–111)
Creatinine, Ser: 0.56 mg/dL (ref 0.44–1.00)
GFR calc Af Amer: >60 mL/min (ref >60)
GFR calc non Af Amer: >60 mL/min (ref >60)
Glucose, Bld: 80 mg/dL (ref 70–99)
Potassium: 3.9 mmol/L (ref 3.5–5.1)
Sodium: 136 mmol/L (ref 135–145)
Total Bilirubin: 0.4 mg/dL (ref 0.3–1.2)
Total Protein: 5.9 g/dL — ABNORMAL LOW (ref 6.5–8.1)

## 2019-05-22 LAB — RPR: RPR Ser Ql: NONREACTIVE

## 2019-05-22 MED ORDER — DIPHENHYDRAMINE HCL 50 MG/ML IJ SOLN: 12.5000 mg | INTRAMUSCULAR | Status: DC | PRN

## 2019-05-22 MED ORDER — ACETAMINOPHEN 325 MG PO TABS: 650.0000 mg | ORAL_TABLET | Freq: Four times a day (QID) | ORAL | Status: DC | PRN

## 2019-05-22 MED ORDER — SODIUM CHLORIDE (PF) 0.9 % IJ SOLN
INTRAMUSCULAR | Status: DC | PRN
  Administered 2019-05-22: 12 mL/h via EPIDURAL

## 2019-05-22 MED ORDER — DIBUCAINE (PERIANAL) 1 % EX OINT: 1.0000 "application " | TOPICAL_OINTMENT | CUTANEOUS | Status: DC | PRN

## 2019-05-22 MED ORDER — PRENATAL MULTIVITAMIN CH
1.0000 | ORAL_TABLET | Freq: Every day | ORAL | Status: DC
  Administered 2019-05-22: 12:00:00 1 via ORAL
  Filled 2019-05-22: qty 1

## 2019-05-22 MED ORDER — EPHEDRINE 5 MG/ML INJ: 10.0000 mg | INTRAVENOUS | Status: DC | PRN

## 2019-05-22 MED ORDER — LIDOCAINE-EPINEPHRINE (PF) 2 %-1:200000 IJ SOLN
INTRAMUSCULAR | Status: DC | PRN
  Administered 2019-05-22 (×2): 2 mL via EPIDURAL

## 2019-05-22 MED ORDER — DIPHENHYDRAMINE HCL 25 MG PO CAPS: 25.0000 mg | ORAL_CAPSULE | Freq: Four times a day (QID) | ORAL | Status: DC | PRN

## 2019-05-22 MED ORDER — ONDANSETRON HCL 4 MG/2ML IJ SOLN: 4.0000 mg | INTRAMUSCULAR | Status: DC | PRN

## 2019-05-22 MED ORDER — PHENYLEPHRINE 40 MCG/ML (10ML) SYRINGE FOR IV PUSH (FOR BLOOD PRESSURE SUPPORT): 80.0000 ug | PREFILLED_SYRINGE | INTRAVENOUS | Status: DC | PRN

## 2019-05-22 MED ORDER — WITCH HAZEL-GLYCERIN EX PADS: 1.0000 "application " | MEDICATED_PAD | CUTANEOUS | Status: DC | PRN

## 2019-05-22 MED ORDER — TETANUS-DIPHTH-ACELL PERTUSSIS 5-2.5-18.5 LF-MCG/0.5 IM SUSP: 0.5000 mL | Freq: Once | INTRAMUSCULAR | Status: DC

## 2019-05-22 MED ORDER — ONDANSETRON HCL 4 MG PO TABS: 4.0000 mg | ORAL_TABLET | ORAL | Status: DC | PRN

## 2019-05-22 MED ORDER — LACTATED RINGERS IV SOLN
500.0000 mL | Freq: Once | INTRAVENOUS | Status: AC
  Administered 2019-05-22: 03:00:00 500 mL via INTRAVENOUS

## 2019-05-22 MED ORDER — IBUPROFEN 600 MG PO TABS
600.0000 mg | ORAL_TABLET | Freq: Three times a day (TID) | ORAL | Status: DC | PRN
  Administered 2019-05-22 – 2019-05-23 (×3): 600 mg via ORAL
  Filled 2019-05-22 (×3): qty 1

## 2019-05-22 MED ORDER — MEASLES, MUMPS & RUBELLA VAC IJ SOLR: 0.5000 mL | Freq: Once | INTRAMUSCULAR | Status: DC

## 2019-05-22 MED ORDER — FENTANYL-BUPIVACAINE-NACL 0.5-0.125-0.9 MG/250ML-% EP SOLN
12.0000 mL/h | EPIDURAL | Status: DC | PRN
  Filled 2019-05-22: qty 250

## 2019-05-22 MED ORDER — BENZOCAINE-MENTHOL 20-0.5 % EX AERO
1.0000 "application " | INHALATION_SPRAY | CUTANEOUS | Status: DC | PRN
  Administered 2019-05-22: 1 via TOPICAL
  Filled 2019-05-22: qty 56

## 2019-05-22 MED ORDER — SENNOSIDES-DOCUSATE SODIUM 8.6-50 MG PO TABS
2.0000 | ORAL_TABLET | ORAL | Status: DC
  Filled 2019-05-22: qty 2

## 2019-05-22 MED ORDER — COCONUT OIL OIL: 1.0000 "application " | TOPICAL_OIL | Status: DC | PRN

## 2019-05-22 MED ORDER — SIMETHICONE 80 MG PO CHEW: 80.0000 mg | CHEWABLE_TABLET | ORAL | Status: DC | PRN

## 2019-05-22 NOTE — Anesthesia Procedure Notes (Signed)
Epidural Patient location during procedure: OB Start time: 05/22/2019 2:40 AM End time: 05/22/2019 2:55 AM  Staffing Anesthesiologist: Elmer Picker, MD Performed: anesthesiologist   Preanesthetic Checklist Completed: patient identified, IV checked, risks and benefits discussed, monitors and equipment checked, pre-op evaluation and timeout performed  Epidural Patient position: sitting Prep: DuraPrep and site prepped and draped Patient monitoring: continuous pulse ox, blood pressure, heart rate and cardiac monitor Approach: midline Location: L3-L4 Injection technique: LOR air  Needle:  Needle type: Tuohy  Needle gauge: 17 G Needle length: 9 cm Needle insertion depth: 7 cm Catheter type: closed end flexible Catheter size: 19 Gauge Catheter at skin depth: 13 cm Test dose: negative  Assessment Sensory level: T8 Events: blood not aspirated, injection not painful, no injection resistance, no paresthesia and negative IV test  Additional Notes Patient identified. Risks/Benefits/Options discussed with patient including but not limited to bleeding, infection, nerve damage, paralysis, failed block, incomplete pain control, headache, blood pressure changes, nausea, vomiting, reactions to medication both or allergic, itching and postpartum back pain. Confirmed with bedside nurse the patient's most recent platelet count. Confirmed with patient that they are not currently taking any anticoagulation, have any bleeding history or any family history of bleeding disorders. Patient expressed understanding and wished to proceed. All questions were answered. Sterile technique was used throughout the entire procedure. Please see nursing notes for vital signs. Test dose was given through epidural catheter and negative prior to continuing to dose epidural or start infusion. Warning signs of high block given to the patient including shortness of breath, tingling/numbness in hands, complete motor block, or  any concerning symptoms with instructions to call for help. Patient was given instructions on fall risk and not to get out of bed. All questions and concerns addressed with instructions to call with any issues or inadequate analgesia.  Reason for block:procedure for pain

## 2019-05-22 NOTE — Progress Notes (Signed)
Labor Progress Note Christine Bailey is a 24 y.o. G1P0 at [redacted]w[redacted]d presented for possible vaginal bleeding and admitted for IOL for NRFHT.  S: Feeling cramping with ctx.   O:  BP (!) 148/80   Pulse 96   Temp 98.5 F (36.9 C) (Oral)   Resp 16   Ht 5\' 7"  (1.702 m)   Wt 89.8 kg   LMP 08/13/2018 (Within Days)   BMI 31.01 kg/m  EFM: 145, moderate variability, pos accels, no decels, reactive TOCO: q2-16m  CVE: Dilation: 5 Effacement (%): 80 Cervical Position: Posterior Station: -2 Presentation: Vertex Exam by:: Bre Price RN   A&P: 24 y.o. G1P0 109w2d came to MAU for possible vaginal bleeding and admitted for IOL for NRFHT (variables). #Labor: Progressing well. Pitocin at 12. S/p FB. Cont Pitocin titration as able. AROM as indicated. Anticipate SVD.  #Pain: per patient request #FWB: Cat I #GBS positive; PCN #Chlamydia positive 04/23/2018: Treated on 1/6. TOC in process.   06/22/2018, MD 2:07 AM

## 2019-05-22 NOTE — Anesthesia Postprocedure Evaluation (Signed)
Anesthesia Post Note  Patient: Christine Bailey  Procedure(s) Performed: AN AD HOC LABOR EPIDURAL     Patient location during evaluation: Mother Baby Anesthesia Type: Epidural Level of consciousness: awake and alert Pain management: pain level controlled Vital Signs Assessment: post-procedure vital signs reviewed and stable Respiratory status: spontaneous breathing, nonlabored ventilation and respiratory function stable Cardiovascular status: stable Postop Assessment: no headache, no backache, epidural receding, no apparent nausea or vomiting, patient able to bend at knees and adequate PO intake Anesthetic complications: no    Last Vitals:  Vitals:   05/22/19 0609 05/22/19 0740  BP: 132/76 130/82  Pulse: 80 79  Resp: 17 20  Temp: 37.2 C 37.5 C  SpO2: 100% 100%    Last Pain:  Vitals:   05/22/19 0740  TempSrc: Oral  PainSc: 0-No pain   Pain Goal:                   Blythe Stanford

## 2019-05-22 NOTE — Discharge Summary (Signed)
Postpartum Discharge Summary      Patient Name: Christine Bailey DOB: 1995/10/14 MRN: [id number]  Date of admission: 05/21/2019 Delivering Provider: Chauncey Mann   Date of discharge: 05/23/2019  Admitting diagnosis: Post-dates pregnancy [O48.0] Intrauterine pregnancy: [redacted]w[redacted]d    Secondary diagnosis:  Active Problems:   Chlamydia infection affecting pregnancy in third trimester   Group B Streptococcus carrier, +RV culture, currently pregnant   Post-dates pregnancy   Non-reassuring electronic fetal monitoring tracing  Additional problems: None     Discharge diagnosis: Term Pregnancy Delivered                                                                                                Post partum procedures:None  Augmentation: Pitocin and Foley Balloon  Complications: None  Hospital course:  Onset of Labor With Vaginal Delivery     24y.o. yo G1P0 at 455w2das admitted in Latent Labor on 05/21/2019. Patient had an uncomplicated labor course as follows: Presented to MAU for a labor check and noted to Cat II FHT. Initial SVE: 1.5/80/-2. Patient received Foley bulb, Pitocin and epidural. SROM occurred. She then progressed to complete.  Membrane Rupture Time/Date: 3:46 AM ,05/22/2019   Intrapartum Procedures: Episiotomy: None [1]                                         Lacerations:  None [1]  Patient had a delivery of a Viable infant. 05/22/2019  Information for the patient's newborn:  JoPocahontas, Cohenourirl Sherlynn [[id number]]Delivery Method: Vaginal, Spontaneous(Filed from Delivery Summary)     Pateint had an uncomplicated postpartum course.  She is ambulating, tolerating a regular diet, passing flatus, and urinating well. Patient is discharged home in stable condition on 05/23/19.  Delivery time: 4:10 AM    Magnesium Sulfate received: No BMZ received: No Rhophylac:No MMR:No Transfusion:No  Physical exam  Vitals:   05/22/19 1211 05/22/19 1600 05/22/19 2010 05/23/19 0532  BP: 129/74  127/69 133/83 134/79  Pulse: 89 96 91 78  Resp:   15 16  Temp: 99.6 F (37.6 C) 99 F (37.2 C) 98.6 F (37 C) 98 F (36.7 C)  TempSrc: Oral Oral Oral Oral  SpO2: 99% 100% 100% 100%  Weight:      Height:       General: alert, cooperative and no distress Lochia: appropriate Uterine Fundus: firm Incision: N/A DVT Evaluation: No evidence of DVT seen on physical exam. Labs: Lab Results  Component Value Date   WBC 9.5 05/21/2019   HGB 11.7 (L) 05/21/2019   HCT 33.5 (L) 05/21/2019   MCV 91.3 05/21/2019   PLT 218 05/21/2019   CMP Latest Ref Rng & Units 05/21/2019  Glucose 70 - 99 mg/dL 80  BUN 6 - 20 mg/dL 7  Creatinine 0.44 - 1.00 mg/dL 0.56  Sodium 135 - 145 mmol/L 136  Potassium 3.5 - 5.1 mmol/L 3.9  Chloride 98 - 111 mmol/L 107  CO2 22 - 32 mmol/L 21(L)  Calcium 8.9 -  10.3 mg/dL 9.3  Total Protein 6.5 - 8.1 g/dL 5.9(L)  Total Bilirubin 0.3 - 1.2 mg/dL 0.4  Alkaline Phos 38 - 126 U/L 203(H)  AST 15 - 41 U/L 21  ALT 0 - 44 U/L 14    Discharge instruction: per After Visit Summary and "Baby and Me Booklet".  After visit meds:  Allergies as of 05/23/2019   No Known Allergies     Medication List    TAKE these medications   acetaminophen 325 MG tablet Commonly known as: Tylenol Take 2 tablets (650 mg total) by mouth every 6 (six) hours as needed (for pain scale < 4).   Blood Pressure Kit Devi 1 Device by Does not apply route as needed. ICD 10:Z34.00   ibuprofen 800 MG tablet Commonly known as: ADVIL Take 1 tablet (800 mg total) by mouth every 8 (eight) hours as needed for headache or cramping.   Prenatal Vitamin 27-0.8 MG Tabs Take 1 tablet by mouth daily.       Diet: routine diet  Activity: Advance as tolerated. Pelvic rest for 6 weeks.   Outpatient follow up:4 weeks Follow up Appt: No future appointments. Follow up Visit:    Please schedule this patient for Postpartum visit in: 4 weeks with the following provider: Any provider Virtual Low risk  pregnancy complicated by: none Delivery mode:  SVD Anticipated Birth Control:  considering Nexplanon declines any birth control at DC  PP Procedures needed: none  Schedule Integrated BH visit: no     Newborn Data: Live born female  Birth Weight: 3371g (7lbs6oz)   APGAR: 35, 9  Newborn Delivery   Birth date/time: 05/22/2019 04:10:00 Delivery type: Vaginal, Spontaneous      Baby Feeding: Bottle Disposition:home with mother   Marcille Buffy DNP, CNM  05/23/19  10:32 AM

## 2019-05-22 NOTE — Progress Notes (Signed)
Labor Progress Note Christine Bailey is a 24 y.o. G1P0 at [redacted]w[redacted]d presented for possible vaginal bleeding and admitted for IOL for NRFHT.  S: Feeling ctx. Foley bulb out.  O:  BP 129/70   Pulse 89   Temp 98.5 F (36.9 C) (Oral)   Resp 16   Ht 5\' 7"  (1.702 m)   Wt 89.8 kg   LMP 08/13/2018 (Within Days)   BMI 31.01 kg/m  EFM: 135, moderate variability, pos accels, no decels, reactive TOCO: q2-29m  CVE: Dilation: 5 Effacement (%): 80 Cervical Position: Posterior Station: -2 Presentation: Vertex Exam by:: Bre Price RN   A&P: 24 y.o. G1P0 [redacted]w[redacted]d came to MAU for possible vaginal bleeding and admitted for IOL for NRFHT (variables). #Labor: Progressing well. Pitocin at 10. S/p FB. Cont Pitocin titration as able. AROM as indicated. Anticipate SVD.  #Pain: per patient request #FWB: Cat I #GBS positive; PCN #Chlamydia positive 04/23/2018: Treated on 1/6. TOC in process.   06/22/2018, MD 12:04 AM

## 2019-05-22 NOTE — Anesthesia Preprocedure Evaluation (Signed)
Anesthesia Evaluation  Patient identified by MRN, date of birth, ID band Patient awake    Reviewed: Allergy & Precautions, NPO status , Patient's Chart, lab work & pertinent test results  Airway Mallampati: II  TM Distance: >3 FB Neck ROM: Full    Dental no notable dental hx.    Pulmonary neg pulmonary ROS,    Pulmonary exam normal breath sounds clear to auscultation       Cardiovascular hypertension (gHTN), Normal cardiovascular exam Rhythm:Regular Rate:Normal     Neuro/Psych negative neurological ROS  negative psych ROS   GI/Hepatic negative GI ROS, Neg liver ROS,   Endo/Other  negative endocrine ROS  Renal/GU negative Renal ROS  negative genitourinary   Musculoskeletal negative musculoskeletal ROS (+)   Abdominal   Peds  Hematology negative hematology ROS (+)   Anesthesia Other Findings   Reproductive/Obstetrics (+) Pregnancy                            Anesthesia Physical Anesthesia Plan  ASA: III  Anesthesia Plan: Epidural   Post-op Pain Management:    Induction:   PONV Risk Score and Plan: Treatment may vary due to age or medical condition  Airway Management Planned: Natural Airway  Additional Equipment:   Intra-op Plan:   Post-operative Plan:   Informed Consent: I have reviewed the patients History and Physical, chart, labs and discussed the procedure including the risks, benefits and alternatives for the proposed anesthesia with the patient or authorized representative who has indicated his/her understanding and acceptance.       Plan Discussed with: Anesthesiologist  Anesthesia Plan Comments: (Patient identified. Risks, benefits, options discussed with patient including but not limited to bleeding, infection, nerve damage, paralysis, failed block, incomplete pain control, headache, blood pressure changes, nausea, vomiting, reactions to medication, itching, and  post partum back pain. Confirmed with bedside nurse the patient's most recent platelet count. Confirmed with the patient that they are not taking any anticoagulation, have any bleeding history or any family history of bleeding disorders. Patient expressed understanding and wishes to proceed. All questions were answered. )        Anesthesia Quick Evaluation  

## 2019-05-23 LAB — GC/CHLAMYDIA PROBE AMP (~~LOC~~) NOT AT ARMC
Chlamydia: NEGATIVE
Comment: NEGATIVE
Comment: NORMAL
Neisseria Gonorrhea: NEGATIVE

## 2019-05-23 MED ORDER — ACETAMINOPHEN 325 MG PO TABS: 650.0000 mg | ORAL_TABLET | Freq: Four times a day (QID) | ORAL | 0 refills | Status: DC | PRN

## 2019-05-23 MED ORDER — IBUPROFEN 800 MG PO TABS: 800.0000 mg | ORAL_TABLET | Freq: Three times a day (TID) | ORAL | 0 refills | Status: DC | PRN

## 2019-05-24 ENCOUNTER — Other Ambulatory Visit: Payer: Medicaid Other

## 2019-05-24 ENCOUNTER — Encounter: Payer: Medicaid Other | Admitting: Medical

## 2019-05-27 ENCOUNTER — Inpatient Hospital Stay (HOSPITAL_COMMUNITY): Payer: Medicaid Other

## 2019-06-06 ENCOUNTER — Other Ambulatory Visit: Payer: Medicaid Other

## 2019-06-22 ENCOUNTER — Telehealth (INDEPENDENT_AMBULATORY_CARE_PROVIDER_SITE_OTHER): Payer: Medicaid Other | Admitting: Obstetrics and Gynecology

## 2019-06-22 DIAGNOSIS — Z1389 Encounter for screening for other disorder: Secondary | ICD-10-CM

## 2019-06-22 NOTE — Progress Notes (Signed)
I connected with@ on 06/22/19 at  3:15 PM EST by: Telephone and verified that I am speaking with the correct person using two identifiers.  Patient is located at home and provider is located at [place].     The purpose of this virtual visit is to provide medical care while limiting exposure to the novel coronavirus. I discussed the limitations, risks, security and privacy concerns of performing an evaluation and management service by Mychart and the availability of in person appointments. I also discussed with the patient that there may be a patient responsible charge related to this service. By engaging in this virtual visit, you consent to the provision of healthcare.  Additionally, you authorize for your insurance to be billed for the services provided during this visit.  The patient expressed understanding and agreed to proceed.  The following staff members participated in the virtual visit:  Victorino Dike Nirvi Boehler,NP  Post Partum Visit Note Subjective:    [name] is a 24 y.o. G54P1001 female who presents for a postpartum visit. She is 5 week postpartum following a SVD. I have fully reviewed the prenatal and intrapartum course. The delivery was at 40.2 gestational weeks. Outcome: spontaneous vaginal delivery. Anesthesia: epidural. Postpartum course has been unremarkable. Baby's course has been unremarkable. Baby is feeding by bottle - gerber. Bleeding no bleeding. Bowel function is normal. Bladder function is normal. Patient is not sexually active. Contraception method is undecided. Postpartum depression screening: Negative.  The following portions of the patient's history were reviewed and updated as appropriate: allergies, current medications, past family history, past medical history, past social history, past surgical history and problem list.  Review of Systems Pertinent items are noted in HPI.   Objective:   Vitals:   06/22/19 1516  BP: 119/71  Pulse: 87   Self-Obtained  119/71 BP  today HR 87       Assessment:   Normal postpartum exam. Pap smear not done at today's visit. Last pap smear Never . Next pap due this year.   Plan:   1. Contraception: none- declined  2. Call for an annual visit.  3. Follow up as needed.   8 minutes of non-face-to-face time spent with the patient   Deleon Passe, Harolyn Rutherford, NP 06/22/2019 3:48 PM

## 2019-08-29 ENCOUNTER — Ambulatory Visit (INDEPENDENT_AMBULATORY_CARE_PROVIDER_SITE_OTHER): Payer: Medicaid Other | Admitting: *Deleted

## 2019-08-29 ENCOUNTER — Other Ambulatory Visit: Payer: Self-pay

## 2019-08-29 DIAGNOSIS — Z3201 Encounter for pregnancy test, result positive: Secondary | ICD-10-CM | POA: Diagnosis not present

## 2019-08-29 DIAGNOSIS — Z3490 Encounter for supervision of normal pregnancy, unspecified, unspecified trimester: Secondary | ICD-10-CM

## 2019-08-29 LAB — POCT PREGNANCY, URINE: Preg Test, Ur: POSITIVE — AB

## 2019-08-29 NOTE — Progress Notes (Signed)
Here for pregnancy test , did home pregnancy test which was positive. States had baby 05/22/19. States had one period since delivery  - not sure when maybe March or April. States it came shortly after she finally stopped bleeding  from after delivery  Will do upt. UPT positive. Will do dating [place]. Plans to get prenatal care with [place] again. Explained will have [place] and then come to [place] for results; then can schedule prenatal care. She voices understanding. Lashante Fryberger,RN

## 2019-08-29 NOTE — Progress Notes (Signed)
I have reviewed this chart and agree with the RN/CMA assessment and management.    K. Meryl Davis, M.D. Attending [facility] (Faculty Practice)   

## 2019-09-05 ENCOUNTER — Ambulatory Visit
Admission: RE | Admit: 2019-09-05 | Discharge: 2019-09-05 | Disposition: A | Discharge status: Home / Self Care | Payer: Medicaid Other | Source: Ambulatory Visit | Attending: Obstetrics and Gynecology | Admitting: Obstetrics and Gynecology

## 2019-09-05 ENCOUNTER — Other Ambulatory Visit: Payer: Self-pay

## 2019-09-05 DIAGNOSIS — Z3490 Encounter for supervision of normal pregnancy, unspecified, unspecified trimester: Secondary | ICD-10-CM

## 2019-09-07 ENCOUNTER — Other Ambulatory Visit: Payer: Self-pay | Admitting: Obstetrics and Gynecology

## 2019-09-07 ENCOUNTER — Other Ambulatory Visit: Payer: Self-pay

## 2019-09-07 ENCOUNTER — Ambulatory Visit
Admission: RE | Admit: 2019-09-07 | Discharge: 2019-09-07 | Disposition: A | Discharge status: Home / Self Care | Payer: Medicaid Other | Source: Ambulatory Visit | Attending: Obstetrics and Gynecology | Admitting: Obstetrics and Gynecology

## 2019-09-07 DIAGNOSIS — O26841 Uterine size-date discrepancy, first trimester: Secondary | ICD-10-CM | POA: Diagnosis not present

## 2019-09-07 DIAGNOSIS — Z3490 Encounter for supervision of normal pregnancy, unspecified, unspecified trimester: Secondary | ICD-10-CM

## 2019-09-07 DIAGNOSIS — Z3A08 8 weeks gestation of pregnancy: Secondary | ICD-10-CM | POA: Diagnosis not present

## 2019-10-10 ENCOUNTER — Other Ambulatory Visit: Payer: Self-pay

## 2019-10-10 ENCOUNTER — Ambulatory Visit (INDEPENDENT_AMBULATORY_CARE_PROVIDER_SITE_OTHER): Payer: Medicaid Other | Admitting: *Deleted

## 2019-10-10 DIAGNOSIS — O0990 Supervision of high risk pregnancy, unspecified, unspecified trimester: Secondary | ICD-10-CM | POA: Insufficient documentation

## 2019-10-10 DIAGNOSIS — O09899 Supervision of other high risk pregnancies, unspecified trimester: Secondary | ICD-10-CM

## 2019-10-10 DIAGNOSIS — Z3490 Encounter for supervision of normal pregnancy, unspecified, unspecified trimester: Secondary | ICD-10-CM

## 2019-10-10 HISTORY — DX: Supervision of high risk pregnancy, unspecified, unspecified trimester: O09.90

## 2019-10-10 NOTE — Patient Instructions (Signed)

## 2019-10-10 NOTE — Addendum Note (Signed)
Addended byGerome Apley on: 10/10/2019 12:08 PM   Modules accepted: Orders

## 2019-10-10 NOTE — Progress Notes (Signed)
Chart reviewed for nurse visit. Agree with plan of care.   Marylene Land, CNM 10/10/2019 12:49 PM

## 2019-10-10 NOTE — Progress Notes (Signed)
I connected with  [name] on 10/10/19 at 11:15 AM EDT by telephone and verified that I am speaking with the correct person using two identifiers.   I discussed the limitations, risks, security and privacy concerns of performing an evaluation and management service by telephone and the availability of in person appointments. I also discussed with the patient that there may be a patient responsible charge related to this service. The patient expressed understanding and agreed to proceed.  I explained I am completing her New OB Intake today. We discussed Her EDD and that it is based on  An early [place] due to unsure LMP due to recent pregnancy. She reports she only had one period after her last baby.  I reviewed her allergies, meds, OB History, Medical /Surgical history, and appropriate screenings. I informed her of [facility] services.   I explained I will send her the [employer] app and app was sent to her while on phone. She did not receive the email and I explained I will reach out to [employer] and to please check for the email later today, tomorrow.   I explained we will again still have her take her blood pressure weekly during this pregnancy. She confirmed she still has her blood pressure cuff from last pregnancy and remembers how to use it. I informed her if she has any questions about it- she can bring it with her to her first ob appointment. Explained  then we will have her take her blood pressure weekly and enter into the app.  I explained she will have some visits in office and some virtually. She already has Sports coach.  I reviewed her new ob  appointment date/ time with her , our location and to wear mask, no visitors.  I explained she will have a pelvic exam, ob bloodwork, hemoglobin a1C, cbg ,pap, and  genetic testing if desired,- she does want a panorama. I scheduled an [place] at 19 weeks and gave her the appointment. She voices understanding.   Simona Huh, RN 10/10/2019  11:07 AM

## 2019-10-12 DIAGNOSIS — Z3482 Encounter for supervision of other normal pregnancy, second trimester: Secondary | ICD-10-CM | POA: Diagnosis not present

## 2019-10-17 ENCOUNTER — Ambulatory Visit (INDEPENDENT_AMBULATORY_CARE_PROVIDER_SITE_OTHER): Payer: Medicaid Other | Admitting: Student

## 2019-10-17 ENCOUNTER — Other Ambulatory Visit: Payer: Self-pay

## 2019-10-17 ENCOUNTER — Encounter: Payer: Self-pay | Admitting: Student

## 2019-10-17 ENCOUNTER — Encounter: Payer: Self-pay | Admitting: *Deleted

## 2019-10-17 ENCOUNTER — Other Ambulatory Visit (HOSPITAL_COMMUNITY)
Admission: RE | Admit: 2019-10-17 | Discharge: 2019-10-17 | Disposition: A | Discharge status: Home / Self Care | Payer: Medicaid Other | Source: Ambulatory Visit | Attending: Student | Admitting: Student

## 2019-10-17 VITALS — Wt 179.8 lb

## 2019-10-17 DIAGNOSIS — Z3A14 14 weeks gestation of pregnancy: Secondary | ICD-10-CM | POA: Diagnosis not present

## 2019-10-17 DIAGNOSIS — Z3492 Encounter for supervision of normal pregnancy, unspecified, second trimester: Secondary | ICD-10-CM | POA: Diagnosis not present

## 2019-10-17 DIAGNOSIS — Z148 Genetic carrier of other disease: Secondary | ICD-10-CM | POA: Insufficient documentation

## 2019-10-17 DIAGNOSIS — Z3482 Encounter for supervision of other normal pregnancy, second trimester: Secondary | ICD-10-CM | POA: Diagnosis not present

## 2019-10-17 NOTE — Progress Notes (Signed)
  Subjective:    Christine Bailey is being seen today for her first obstetrical visit.  This is not a planned pregnancy. She is at [redacted]w[redacted]d gestation. Her obstetrical history is significant for short interval between pregnancies. . Relationship with FOB: significant other, living together. Patient does intend to breast feed. Pregnancy history fully reviewed.  Patient reports no complaints.  Review of Systems:   Review of Systems  Constitutional: Negative.   HENT: Negative.   Respiratory: Negative.   Cardiovascular: Negative.   Gastrointestinal: Negative.   Genitourinary: Negative.   Musculoskeletal: Negative.     Objective:     Wt 179 lb 12.8 oz (81.6 kg)   LMP  (LMP Unknown)   BMI 28.16 kg/m  Physical Exam Constitutional:      Appearance: Normal appearance.  HENT:     Head: Normocephalic.     Nose: Nose normal.  Genitourinary:    General: Normal vulva.     Vagina: No vaginal discharge.     Rectum: Guaiac result negative.  Musculoskeletal:        General: Normal range of motion.     Cervical back: Normal range of motion.  Skin:    General: Skin is warm.  Neurological:     Mental Status: She is alert.     Maternal Exam:  Introitus: Normal vulva. Vagina is negative for discharge.       Assessment:    Pregnancy: G2P1001 Patient Active Problem List   Diagnosis Date Noted  . Supervision of low-risk pregnancy 10/10/2019  . Short interval between pregnancies affecting pregnancy, antepartum 10/10/2019  . Postpartum exam 06/22/2019  . Eczema 11/01/2018       Plan:     Initial labs drawn. Prenatal vitamins. Problem list reviewed and updated. AFP3 discussed:  too early. Role of ultrasound in pregnancy discussed; fetal survey: ordered. Amniocentesis discussed: not indicated. Follow up in 4 weeks. 50% of 30 min visit spent on counseling and coordination of care.  -signed up for Baby RX -has signed up for My Chart -Pap today -genetic testing today; will draw AFP  at anatomy scan -All questions answered Charlesetta Garibaldi [facility] 10/17/2019

## 2019-10-17 NOTE — Progress Notes (Signed)
Medicaid Home Form Completed-10/17/19

## 2019-10-17 NOTE — Patient Instructions (Signed)
How to Take Your Blood Pressure You can take your blood pressure at home with a machine. You may need to check your blood pressure at home:  To check if you have high blood pressure (hypertension).  To check your blood pressure over time.  To make sure your blood pressure medicine is working. Supplies needed: You will need a blood pressure machine, or monitor. You can buy one at a drugstore or online. When choosing one:  Choose one with an arm cuff.  Choose one that wraps around your upper arm. Only one finger should fit between your arm and the cuff.  Do not choose one that measures your blood pressure from your wrist or finger. Your doctor can suggest a monitor. How to prepare Avoid these things for 30 minutes before checking your blood pressure:  Drinking caffeine.  Drinking alcohol.  Eating.  Smoking.  Exercising. Five minutes before checking your blood pressure:  Pee.  Sit in a dining chair. Avoid sitting in a soft couch or armchair.  Be quiet. Do not talk. How to take your blood pressure Follow the instructions that came with your machine. If you have a digital blood pressure monitor, these may be the instructions: 1. Sit up straight. 2. Place your feet on the floor. Do not cross your ankles or legs. 3. Rest your left arm at the level of your heart. You may rest it on a table, desk, or chair. 4. Pull up your shirt sleeve. 5. Wrap the blood pressure cuff around the upper part of your left arm. The cuff should be 1 inch (2.5 cm) above your elbow. It is best to wrap the cuff around bare skin. 6. Fit the cuff snugly around your arm. You should be able to place only one finger between the cuff and your arm. 7. Put the cord inside the groove of your elbow. 8. Press the power button. 9. Sit quietly while the cuff fills with air and loses air. 10. Write down the numbers on the screen. 11. Wait 2-3 minutes and then repeat steps 1-10. What do the numbers mean? Two  numbers make up your blood pressure. The first number is called systolic pressure. The second is called diastolic pressure. An example of a blood pressure reading is "120 over 80" (or 120/80). If you are an adult and do not have a medical condition, use this guide to find out if your blood pressure is normal: Normal  First number: below 120.  Second number: below 80. Elevated  First number: 120-129.  Second number: below 80. Hypertension stage 1  First number: 130-139.  Second number: 80-89. Hypertension stage 2  First number: 140 or above.  Second number: 90 or above. Your blood pressure is above normal even if only the top or bottom number is above normal. Follow these instructions at home:  Check your blood pressure as often as your doctor tells you to.  Take your monitor to your next doctor's appointment. Your doctor will: ? Make sure you are using it correctly. ? Make sure it is working right.  Make sure you understand what your blood pressure numbers should be.  Tell your doctor if your medicines are causing side effects. Contact a doctor if:  Your blood pressure keeps being high. Get help right away if:  Your first blood pressure number is higher than 180.  Your second blood pressure number is higher than 120. This information is not intended to replace advice given to you by your health   care provider. Make sure you discuss any questions you have with your health care provider. Document Revised: 03/19/2017 Document Reviewed: 09/13/2015 Elsevier Patient Education  2020 Elsevier Inc.  

## 2019-10-18 ENCOUNTER — Encounter: Payer: Self-pay | Admitting: Student

## 2019-10-18 LAB — HEMOGLOBIN A1C
Est. average glucose Bld gHb Est-mCnc: 91 mg/dL
Hgb A1c MFr Bld: 4.8 % (ref 4.8–5.6)

## 2019-10-18 LAB — CBC/D/PLT+RPR+RH+ABO+RUB AB...
Antibody Screen: NEGATIVE
Basophils Absolute: 0 10*3/uL (ref 0.0–0.2)
Basos: 0 %
EOS (ABSOLUTE): 0.1 10*3/uL (ref 0.0–0.4)
Eos: 2 %
HCV Ab: <0.1 s/co ratio (ref 0.0–0.9)
HIV Screen 4th Generation wRfx: NONREACTIVE
Hematocrit: 33.9 % — ABNORMAL LOW (ref 34.0–46.6)
Hemoglobin: 11.9 g/dL (ref 11.1–15.9)
Hepatitis B Surface Ag: NEGATIVE
Immature Grans (Abs): 0 10*3/uL (ref 0.0–0.1)
Immature Granulocytes: 0 %
Lymphocytes Absolute: 1.1 10*3/uL (ref 0.7–3.1)
Lymphs: 12 %
MCH: 33.1 pg — ABNORMAL HIGH (ref 26.6–33.0)
MCHC: 35.1 g/dL (ref 31.5–35.7)
MCV: 94 fL (ref 79–97)
Monocytes Absolute: 0.5 10*3/uL (ref 0.1–0.9)
Monocytes: 5 %
Neutrophils Absolute: 7 10*3/uL (ref 1.4–7.0)
Neutrophils: 81 %
Platelets: 240 10*3/uL (ref 150–450)
RBC: 3.59 x10E6/uL — ABNORMAL LOW (ref 3.77–5.28)
RDW: 12.6 % (ref 11.7–15.4)
RPR Ser Ql: NONREACTIVE
Rh Factor: POSITIVE
Rubella Antibodies, IGG: 1.06 index (ref >0.99)
WBC: 8.7 10*3/uL (ref 3.4–10.8)

## 2019-10-18 LAB — HCV INTERPRETATION

## 2019-10-19 LAB — CULTURE, OB URINE

## 2019-10-19 LAB — URINE CULTURE, OB REFLEX

## 2019-10-20 LAB — CYTOLOGY - PAP
Chlamydia: NEGATIVE
Comment: NEGATIVE
Comment: NORMAL
Neisseria Gonorrhea: NEGATIVE

## 2019-10-24 ENCOUNTER — Encounter: Payer: Self-pay | Admitting: Family Medicine

## 2019-11-15 ENCOUNTER — Encounter: Payer: Medicaid Other | Admitting: Family Medicine

## 2019-11-21 ENCOUNTER — Ambulatory Visit: Payer: Medicaid Other

## 2019-11-21 ENCOUNTER — Other Ambulatory Visit: Payer: Self-pay

## 2019-11-21 ENCOUNTER — Other Ambulatory Visit: Payer: Medicaid Other

## 2019-11-21 ENCOUNTER — Ambulatory Visit: Payer: Medicaid Other | Attending: Obstetrics and Gynecology

## 2019-11-21 ENCOUNTER — Other Ambulatory Visit: Payer: Self-pay | Admitting: *Deleted

## 2019-11-21 ENCOUNTER — Other Ambulatory Visit: Payer: Self-pay | Admitting: Student

## 2019-11-21 DIAGNOSIS — O09892 Supervision of other high risk pregnancies, second trimester: Secondary | ICD-10-CM

## 2019-11-21 DIAGNOSIS — O321XX0 Maternal care for breech presentation, not applicable or unspecified: Secondary | ICD-10-CM | POA: Diagnosis not present

## 2019-11-21 DIAGNOSIS — Z3482 Encounter for supervision of other normal pregnancy, second trimester: Secondary | ICD-10-CM | POA: Diagnosis not present

## 2019-11-21 DIAGNOSIS — Z3492 Encounter for supervision of normal pregnancy, unspecified, second trimester: Secondary | ICD-10-CM

## 2019-11-21 DIAGNOSIS — Z3A19 19 weeks gestation of pregnancy: Secondary | ICD-10-CM | POA: Diagnosis not present

## 2019-11-21 DIAGNOSIS — Z363 Encounter for antenatal screening for malformations: Secondary | ICD-10-CM | POA: Diagnosis not present

## 2019-11-21 DIAGNOSIS — Z362 Encounter for other antenatal screening follow-up: Secondary | ICD-10-CM

## 2019-11-21 DIAGNOSIS — Z3490 Encounter for supervision of normal pregnancy, unspecified, unspecified trimester: Secondary | ICD-10-CM

## 2019-11-23 LAB — AFP, SERUM, OPEN SPINA BIFIDA
AFP Value: 42 ng/mL
Gest. Age on Collection Date: 19.2 weeks
Maternal Age At EDD: 22.1 yr

## 2019-12-04 ENCOUNTER — Encounter: Payer: Medicaid Other | Admitting: Nurse Practitioner

## 2019-12-06 ENCOUNTER — Telehealth: Payer: Self-pay | Admitting: Lactation Services

## 2019-12-06 NOTE — Telephone Encounter (Signed)
Attempted to call patient to see if she has a more recent weight for AFP. Patient has not been in the office or weighed since 10/17/2019. Unable to reach patient. LM for patient to call the office or send My Chart message.   My Chart message sent to patient.

## 2019-12-07 ENCOUNTER — Telehealth: Payer: Self-pay | Admitting: Lactation Services

## 2019-12-07 NOTE — Telephone Encounter (Signed)
Called patient to see if there is a current weight. Patient did not answer. LM for patient to call the office or answer My chart message.   When I spoke with [facility] yesterday they report that the weight from 6/29 was too long ago to use for AFP testing.

## 2019-12-18 ENCOUNTER — Telehealth (INDEPENDENT_AMBULATORY_CARE_PROVIDER_SITE_OTHER): Payer: Medicaid Other

## 2019-12-18 DIAGNOSIS — Z3492 Encounter for supervision of normal pregnancy, unspecified, second trimester: Secondary | ICD-10-CM

## 2019-12-18 NOTE — Telephone Encounter (Addendum)
-----   Message from Marylene Land, CNM sent at 12/17/2019  3:15 PM EDT ----- Regarding: patient needs AFP Hello! This patient is coming for an anatomy scan on Thursday, 9-2. Is there any way we can add her to the lab schedule so that we can also redraw her AFP? She is missing her weight and we can't calculate it, and she has not been responsive to messages.   Thank you!!! [employer] office notified to add patient to lab schedule for 12/21/19. Lab notified pt will be coming into the office. Lab notified MFM RN pt will need lab day of [place]. Called pt; pt aware of purpose of AFP and that this will need to be drawn day of [place]. Pt notified she will see time in MyChart and if this is prior to [place] she may arrive at that time. Otherwise she will check in with our office directly following [place].

## 2019-12-21 ENCOUNTER — Other Ambulatory Visit: Payer: Self-pay

## 2019-12-21 ENCOUNTER — Ambulatory Visit: Payer: Medicaid Other

## 2019-12-21 ENCOUNTER — Other Ambulatory Visit: Payer: Medicaid Other

## 2019-12-21 DIAGNOSIS — Z3492 Encounter for supervision of normal pregnancy, unspecified, second trimester: Secondary | ICD-10-CM | POA: Diagnosis not present

## 2019-12-23 LAB — AFP, SERUM, OPEN SPINA BIFIDA
AFP MoM: 1.03
AFP Value: 88.4 ng/mL
Gest. Age on Collection Date: 23.5 weeks
Maternal Age At EDD: 22.1 yr
OSBR Risk 1 IN: 10000
Test Results:: NEGATIVE
Weight: 195 [lb_av]

## 2019-12-28 ENCOUNTER — Encounter: Payer: Medicaid Other | Admitting: Obstetrics & Gynecology

## 2020-01-11 ENCOUNTER — Other Ambulatory Visit: Payer: Self-pay

## 2020-01-11 ENCOUNTER — Ambulatory Visit (INDEPENDENT_AMBULATORY_CARE_PROVIDER_SITE_OTHER): Payer: Medicaid Other

## 2020-01-11 VITALS — BP 115/74 | HR 96 | Wt 202.7 lb

## 2020-01-11 DIAGNOSIS — Z3A26 26 weeks gestation of pregnancy: Secondary | ICD-10-CM

## 2020-01-11 DIAGNOSIS — Z3480 Encounter for supervision of other normal pregnancy, unspecified trimester: Secondary | ICD-10-CM

## 2020-01-11 NOTE — Progress Notes (Signed)
   LOW-RISK PREGNANCY OFFICE VISIT  Patient name: Christine Bailey MRN [id number]  Date of birth: 11-07-1995 Chief Complaint:   Routine Prenatal Visit  Subjective:   Christine Bailey is a 24 y.o. G45P1001 female at [redacted]w[redacted]d with an Estimated Date of Delivery: 04/13/20 being seen today for ongoing management of a low-risk pregnancy aeb has Eczema; Postpartum exam; Supervision of low-risk pregnancy; Short interval between pregnancies affecting pregnancy, antepartum; and Genetic carrier on their problem list.  Patient presents today without complaints. Patient endorses fetal movement.  She denies abdominal cramping or contractions as well as vaginal concerns including abnormal discharge, leaking of fluid, and bleeding. Patient considering Paragard IUD for birth control method.  Contractions: Not present. Vag. Bleeding: None.  Movement: Present.  Reviewed past medical,surgical, social, obstetrical and family history as well as problem list, medications and allergies.  Objective   Vitals:   01/11/20 1036  BP: 115/74  Pulse: 96  Weight: 202 lb 11.2 oz (91.9 kg)  Body mass index is 31.75 kg/m.  Total Weight Gain:94 lb 11.2 oz (43 kg)         Physical Examination:   General appearance: Well appearing, and in no distress  Mental status: Alert, oriented to person, place, and time  Skin: Warm & dry  Cardiovascular: Normal heart rate noted  Respiratory: Normal respiratory effort, no distress  Abdomen: Soft, gravid, nontender, AGA with Fundal height of Fundal Height: 27 cm  Pelvic: Cervical exam deferred           Extremities: Edema: None  Fetal Status: Fetal Heart Rate (bpm): 150  Movement: Present   No results found for this or any previous visit (from the past 24 hour(s)).  Assessment & Plan:  Low-risk pregnancy of a 24 y.o., G2P1001 at [redacted]w[redacted]d with an Estimated Date of Delivery: 04/13/20   1. Supervision of other normal pregnancy, antepartum -Reviewed Glucola appt preparation including fasting  the night before and morning of.   -Discussed anticipated office time of 2.5-3 hours.  -Reviewed blood draw procedures and labs which also include check of iron level.  -Discussed how results of GTT are handled including diabetic education and BS testing for abnormal results and routine care for normal results.  -Discussed and given informational pamphlet for Paragard IUD.   2. [redacted] weeks gestation of pregnancy -Anticipatory guidance for upcoming appts. -Scheduled and informed of anatomy US for 10/4.  Will attempt to make appt for GTT Same day.    Meds: No orders of the defined types were placed in this encounter.  Labs/procedures today:  Lab Orders  No laboratory test(s) ordered today     Reviewed: Preterm labor symptoms and general obstetric precautions including but not limited to vaginal bleeding, contractions, leaking of fluid and fetal movement were reviewed in detail with the patient.  All questions were answered.  Follow-up: Return in about 2 weeks (around 01/25/2020) for LROB with GTT.  No orders of the defined types were placed in this encounter.  Cherre Robins MSN, CNM 01/11/2020

## 2020-01-22 ENCOUNTER — Other Ambulatory Visit: Payer: Self-pay | Admitting: Obstetrics and Gynecology

## 2020-01-22 ENCOUNTER — Ambulatory Visit: Payer: Medicaid Other | Attending: Obstetrics

## 2020-01-22 ENCOUNTER — Other Ambulatory Visit: Payer: Self-pay

## 2020-01-22 DIAGNOSIS — Z3A28 28 weeks gestation of pregnancy: Secondary | ICD-10-CM | POA: Diagnosis not present

## 2020-01-22 DIAGNOSIS — Z362 Encounter for other antenatal screening follow-up: Secondary | ICD-10-CM | POA: Diagnosis not present

## 2020-01-22 DIAGNOSIS — O09892 Supervision of other high risk pregnancies, second trimester: Secondary | ICD-10-CM | POA: Diagnosis not present

## 2020-01-23 ENCOUNTER — Ambulatory Visit (INDEPENDENT_AMBULATORY_CARE_PROVIDER_SITE_OTHER): Payer: Medicaid Other | Admitting: Advanced Practice Midwife

## 2020-01-23 ENCOUNTER — Other Ambulatory Visit: Payer: Medicaid Other

## 2020-01-23 ENCOUNTER — Encounter: Payer: Self-pay | Admitting: Advanced Practice Midwife

## 2020-01-23 VITALS — BP 122/70 | HR 103 | Wt 207.0 lb

## 2020-01-23 DIAGNOSIS — Z3480 Encounter for supervision of other normal pregnancy, unspecified trimester: Secondary | ICD-10-CM

## 2020-01-23 DIAGNOSIS — Z3492 Encounter for supervision of normal pregnancy, unspecified, second trimester: Secondary | ICD-10-CM

## 2020-01-23 DIAGNOSIS — Z3A28 28 weeks gestation of pregnancy: Secondary | ICD-10-CM | POA: Diagnosis not present

## 2020-01-23 NOTE — Progress Notes (Signed)
   PRENATAL VISIT NOTE  Subjective:  Christine Bailey is a 24 y.o. G2P1001 at [redacted]w[redacted]d being seen today for ongoing prenatal care.  She is currently monitored for the following issues for this low-risk pregnancy and has Eczema; Postpartum exam; Supervision of low-risk pregnancy; Short interval between pregnancies affecting pregnancy, antepartum; and Genetic carrier on their problem list.  Patient reports no complaints.  Contractions: Not present. Vag. Bleeding: None.  Movement: Present. Denies leaking of fluid.   The following portions of the patient's history were reviewed and updated as appropriate: allergies, current medications, past family history, past medical history, past social history, past surgical history and problem list.   Objective:   Vitals:   01/23/20 0914  BP: 122/70  Pulse: (!) 103  Weight: 207 lb (93.9 kg)    Fetal Status: Fetal Heart Rate (bpm): 154 Fundal Height: 28 cm Movement: Present     General:  Alert, oriented and cooperative. Patient is in no acute distress.  Skin: Skin is warm and dry. No rash noted.   Cardiovascular: Normal heart rate noted  Respiratory: Normal respiratory effort, no problems with respiration noted  Abdomen: Soft, gravid, appropriate for gestational age.  Pain/Pressure: Absent     Pelvic: Cervical exam deferred        Extremities: Normal range of motion.  Edema: None  Mental Status: Normal mood and affect. Normal behavior. Normal judgment and thought content.   Assessment and Plan:  Pregnancy: G2P1001 at [redacted]w[redacted]d 1. Supervision of other normal pregnancy, antepartum  2. [redacted] weeks gestation of pregnancy - Glucose Tolerance, 2 Hours w/1 Hour - CBC - RPR  Preterm labor symptoms and general obstetric precautions including but not limited to vaginal bleeding, contractions, leaking of fluid and fetal movement were reviewed in detail with the patient. Please refer to After Visit Summary for other counseling recommendations.   Return in about 2  weeks (around 02/06/2020) for virtual visit .  No future appointments.  Thressa Sheller DNP, CNM  01/23/20  10:14 AM

## 2020-01-23 NOTE — Patient Instructions (Signed)
COVID-19 Vaccination if You Are Pregnant or Breastfeeding ° °The Society for Maternal-Fetal Medicine (SMFM) and other pregnancy experts recommend that pregnant and lactating people be vaccinated against COVID-19. The Centers for Disease Control and Prevention (CDC) also recommend vaccination for “all people aged 22 years and older, including people who are pregnant, breastfeeding, trying to get pregnant now, or might become pregnant in the future.” Vaccination is the best way to reduce the risks of COVID-19 infection and COVID-related complications for both you and your baby. ° °Three vaccines are available to prevent COVID-19: °• The two-dose Pfizer vaccine for people 12 years and older--APPROVED by the [place] Food and Drug Administration on December 11, 2019 °• The two-dose Moderna vaccine for people 18 years and older--AUTHORIZED for emergency use °• The one-dose Johnson & Johnson vaccine for people 18 years and older (you may also see this vaccine referred to as the “Janssen vaccine”)--AUTHORIZED for emergency use ° °For those receiving the Pfizer and Moderna vaccines, the second dose is given 21 days (Pfizer) and 28 days (Moderna) after the first dose. The Johnson & Johnson vaccine is only one dose. ° °Information for Pregnant Individuals °If you are pregnant or planning to become pregnant and are thinking about getting vaccinated, consider talking with your health care professional about the vaccine.  ° °To help with your decision, you should consider the following key points: °Anyone can get the COVID vaccines free of charge regardless of immigration status or whether they have insurance. You may be asked for your social security number, but it is  °NOT required to get vaccinated. ° °What are benefits of getting the COVID-19 vaccines during pregnancy?  °• The vaccines can help protect you from getting COVID-19. With the two-dose vaccines, you must get both doses for maximum effectiveness. It’s not yet known how  long protection lasts. ° °• Another potential benefit is that getting the vaccine while pregnant may help you pass antiCOVID-19 antibodies to your baby. In numerous studies of vaccinated moms, antibodies were found in the umbilical cord blood of babies and in the mother’s breastmilk. ° °• The CDC, along with other federal partners, are monitoring people who have been vaccinated for serious side effects. So far, more than 139,000 pregnant people have been °vaccinated. No unexpected pregnancy or fetal problems have occurred. There have been no reports of any increased risk of pregnancy loss, growth problems, or birth defects. ° °• A safe vaccine is generally considered one in which the benefits of being vaccinated outweigh the risks. The current vaccines are not live vaccines. There is only a very small  °chance that they cross the placenta, so it’s unlikely that they even reach the fetus. Vaccines don’t affect future fertility. The only people who should NOT get vaccinated are those who have had a severe allergic reaction to vaccines in the past or any vaccine ingredients. ° °• Side effects may occur in the first 3 days after getting vaccinated.1 These include mild to moderate fever, headache, and muscle aches. Side effects may be worse after the second dose of the Pfizer and Moderna vaccines. Fever should be avoided during pregnancy,especially in the first trimester. Those who develop a fever after vaccination can take °acetaminophen (Tylenol). This medication is safe to use during pregnancy and does not  affect how the vaccine works.  ° °What are the known risks of getting COVID-19 during pregnancy?  °About 1 to 3 per 1,000 pregnant women with COVID-19 will develop severe disease. Compared with those who   aren’t pregnant, pregnant people infected by the COVID-19 virus: °• Are 3 times more likely to need ICU care °• Are 2 to 3 times more likely to need advanced life support and a breathing tube  °• Have a small  increased risk of dying due to COVID-19 °They may also be at increased risk of stillbirth and preterm birth. ° °What is my risk of getting COVID-19?  °Your risk of getting COVID-19 depends on the chance that you will come into contact with another infected person. The risk may be higher if you live in a community where there is a lot of COVID-19 infection or work in healthcare or another high-contact setting.  ° °What is my risk for severe complications if I get COVID-19?  °Data show that older pregnant women; those with preexisting health conditions, such as a body mass index higher than 35 kg/m2, diabetes, and heart disorders; and Black or Latinx women have an especially increased risk of severe disease and death from COVID-19. °  °If you still have questions about the vaccines or need more information, ask your health care provider or go to the Centers for Disease Control and Prevention’s COVID-19 vaccine webpage.  ° °An Update on the Johnson & Johnson Vaccine  ° °In April 2021, the FDA and CDC called for a brief pause to use of the Johnson & Johnson vaccine. They did so after reports of a severe side effect in a very small number of women younger than age 50 following vaccination. This side effect, called thrombosis with thrombocytopenia syndrome (TTS), causes blood clots (thrombosis) combined with low levels of platelets (thrombocytopenia). ° °TTS following the Johnson & Johnson vaccine is extremely rare. At the time of this update, it has occurred in only 7 people per 1 million Johnson & Johnson shots given. According to the CDC, being on hormonal birth control (the pill, patch, or ring), pregnancy, breastfeeding, or being recently pregnant does not make you more likely to develop TTS after getting the Johnson & Johnson vaccine. The pause was lifted on August 11, 2019, after the FDA and CDC determined that the known benefits of the Johnson & Johnson vaccine far outweigh the risks.  ° °Health care professionals  have been alerted to the possibility of this side effect in people who have received the Johnson & Johnson vaccine. National organizations continue to recommend COVID-19 vaccination with any of the vaccines for pregnant women. All women younger than age 50 years, whether pregnant, breastfeeding, or not, should be aware of the very rare risk of TTS after getting the Johnson & Johnson vaccine. The Pfizer and Moderna vaccines don’t have this risk. If you get the Johnson & Johnson vaccine,  °seek medical help right away if you develop any of the following symptoms within 3 weeks of getting your shot: ° °• Severe or persistent headaches or blurred vision °• Shortness of breath °• Chest pain °• Leg swelling °• Persistent abdominal pain °• Easy bruising or tiny blood spots under the skin beyond the injection site ° °Experts continue to collect health and safety information from pregnant people who have been vaccinated. If you have questions about vaccination during pregnancy, visit the CDC website or talk to your health care professional. Information for Breastfeeding/Lactating Individuals The Society for Maternal-Fetal Medicine and other pregnancy experts recommend COVID-19 vaccination for people who are breastfeeding/lactating. You don’t have to delay or stop  °breastfeeding just because you get vaccinated.  ° °Getting Vaccinated  °You can get vaccinated at   any time during pregnancy. The CDC is committed to monitoring the vaccine’s safety for all individuals. Your health professional or vaccine clinic may give you information about enrolling in the v-safe after vaccination health checker (see the box below).Even after you’re fully vaccinated, it is important to follow the CDC’s guidance for wearing a mask indoors in areas where there are substantial or high rates of COVID-19 infection.  ° °What Happens When You Enroll in v-Safe?  °The v-safe after vaccination health checker program lets the CDC check in with you after  your vaccination. At sign-up, you can indicate that you are pregnant. Once you do that, expect the following: °• Someone may call you from the v-safe program to ask initial questions and get more information. °• You may be asked to enroll in the vaccine pregnancy registry, which is collecting information about any effects of the vaccine during pregnancy. This is a great way to help scientists monitor the vaccine’s safety and effectiveness.  ° °References °1. Oliver SE, Gargano JW, Marin M, Wallace M, Curran KG, Chamberland M, et al. The Advisory Committee on Immunization Practices’ Interim Recommendation for Use of Pfizer-BioNTech  °COVID-19 Vaccine -- [place], December 2020. MMWR Morbidity and Mortality Weekly Report 2020;69. °2. FDA Briefing Document. Janssen Ad26.COV2.S Vaccine for the Prevention of COVID-19. 2021. Accessed Jun 23, 2019; Available from: https://www.fda.gov/media/146217/download °3. PFIZER-BIONTECH COVID-19 VACCINE [package insert] [place]: Pfizer and [place], [place]: Biontech;2020. °4. FDA Briefing Document. Moderna COVID-19 Vaccine. 2020. Accessed 2020, Dec 18; Available from: https://www.fda.gov/media/144434/download  °5. Gray KJ, Bordt EA, Atyeo C, Deriso E, Akinwunmi B, Young N, et al. COVID-19 vaccine response in pregnant and lactating women: a cohort study. Am J Obstet Gynecol 2021 Mar 24. °6. Panagiotakopoulos L, Myers TR, Gee J, Lipkind HS, Kharbanda EO, Ryan DS, et al. SARS-CoV-2 Infection Among Hospitalized Pregnant Women: Reasons for Admission and Pregnancy  °Characteristics - [facility], March 1-Sep 17, 2018. MMWR Morb Mortal Wkly Rep 2020 Sep 23;69(38):1355-9. °7. Zambrano LD, Ellington S, Strid P, Galang RR, Oduyebo T, Tong VT, et al. Update: Characteristics of Symptomatic Women of Reproductive Age with Laboratory-Confirmed SARSCoV-2 Infection by Pregnancy Status - [place], January 22-January 21, 2019. MMWR Morb Mortal Wkly Rep 2020 Nov  6;69(44):1641-7. °8. Delahoy MJ, Whitaker M, O'Halloran A, Chai SJ, Kirley PD, Alden N, et al. Characteristics and Maternal and Birth Outcomes of Hospitalized Pregnant Women with Laboratory-Confirmed COVID-19 - COVID-[place], 13 [place], March 1-December 10, 2018. MMWR Morb Mortal Wkly Rep 2020 Sep 25;69(38):1347-54. ° °

## 2020-01-26 ENCOUNTER — Encounter: Payer: Self-pay | Admitting: Advanced Practice Midwife

## 2020-01-26 DIAGNOSIS — O24419 Gestational diabetes mellitus in pregnancy, unspecified control: Secondary | ICD-10-CM | POA: Insufficient documentation

## 2020-01-26 LAB — RPR: RPR Ser Ql: NONREACTIVE

## 2020-01-26 LAB — CBC
Hematocrit: 33 % — ABNORMAL LOW (ref 34.0–46.6)
Hemoglobin: 11 g/dL — ABNORMAL LOW (ref 11.1–15.9)
MCH: 31.2 pg (ref 26.6–33.0)
MCHC: 33.3 g/dL (ref 31.5–35.7)
MCV: 94 fL (ref 79–97)
Platelets: 233 10*3/uL (ref 150–450)
RBC: 3.53 x10E6/uL — ABNORMAL LOW (ref 3.77–5.28)
RDW: 12.1 % (ref 11.7–15.4)
WBC: 11.4 10*3/uL — ABNORMAL HIGH (ref 3.4–10.8)

## 2020-01-26 LAB — GLUCOSE TOLERANCE, 2 HOURS W/ 1HR
Glucose, 1 hour: 150 mg/dL (ref 65–179)
Glucose, 2 hour: 80 mg/dL (ref 65–152)
Glucose, Fasting: 92 mg/dL — ABNORMAL HIGH (ref 65–91)

## 2020-02-01 ENCOUNTER — Encounter: Payer: Medicaid Other | Attending: Medical | Admitting: Registered"

## 2020-02-01 ENCOUNTER — Other Ambulatory Visit: Payer: Self-pay | Admitting: Lactation Services

## 2020-02-01 ENCOUNTER — Ambulatory Visit: Payer: Medicaid Other | Admitting: Registered"

## 2020-02-01 VITALS — Ht 67.0 in | Wt 210.0 lb

## 2020-02-01 DIAGNOSIS — Z3A00 Weeks of gestation of pregnancy not specified: Secondary | ICD-10-CM | POA: Diagnosis not present

## 2020-02-01 DIAGNOSIS — O24419 Gestational diabetes mellitus in pregnancy, unspecified control: Secondary | ICD-10-CM

## 2020-02-01 MED ORDER — ACCU-CHEK GUIDE VI STRP: ORAL_STRIP | 12 refills | Status: DC

## 2020-02-01 MED ORDER — ACCU-CHEK SOFTCLIX LANCETS MISC: 12 refills | Status: DC

## 2020-02-01 NOTE — Progress Notes (Signed)
Patient was seen on 02/01/20 for Gestational Diabetes self-management. EDD 04/13/20. Patient states no history of GDM. Diet history obtained. Patient eats variety of all food groups. Patient reports daily structured physical activity.  The following learning objectives were met by the patient :   States the definition of Gestational Diabetes  States why dietary management is important in controlling blood glucose  Describes the effects of carbohydrates on blood glucose levels  Demonstrates ability to create a balanced meal plan  Demonstrates carbohydrate counting   States when to check blood glucose levels  Demonstrates proper blood glucose monitoring techniques  States the effect of stress and exercise on blood glucose levels  States the importance of limiting caffeine and abstaining from alcohol and smoking  Plan:  Aim for 3 Carbohydrate Choices per meal (45 grams) +/- 1 either way  Aim for 1-2 Carbohydrate Choices per snack Begin reading food labels for Total Carbohydrate of foods If OK with your MD, consider  increasing your activity level by walking, Arm Chair Exercises or other activity daily as tolerated Begin checking Blood Glucose before breakfast and 2 hours after first bite of breakfast, lunch and dinner as directed by MD  Bring Log Book/Sheet and meter to every medical appointment  Baby Scripts: Patient was introduced to [employer] and  plans to use as record of blood glucose electronically  Take medication if directed by MD  Blood glucose monitor given: Accu-chek Guide Me Lot #[id number] Exp:02/06/2021 CBG: 94 mg/dL  Rx order placed for  Accu-chek Guide strips and Softclix lancets  Patient instructed to monitor glucose levels: FBS: 60 - 95 mg/dl 2 hour: <120 mg/dl  Patient received the following handouts:  Nutrition Diabetes and Pregnancy  Carbohydrate Counting List  Blood glucose Log Sheet  Patient will be seen for follow-up as needed.

## 2020-02-01 NOTE — Progress Notes (Signed)
Accu Chek Strips and Lancets sent to Pharmacy at request of Heywood Bene, RD

## 2020-02-05 ENCOUNTER — Encounter: Payer: Self-pay | Admitting: Medical

## 2020-02-06 ENCOUNTER — Telehealth (INDEPENDENT_AMBULATORY_CARE_PROVIDER_SITE_OTHER): Payer: Medicaid Other | Admitting: Medical

## 2020-02-06 ENCOUNTER — Encounter: Payer: Self-pay | Admitting: Medical

## 2020-02-06 VITALS — BP 123/70 | HR 108

## 2020-02-06 DIAGNOSIS — Z3A30 30 weeks gestation of pregnancy: Secondary | ICD-10-CM

## 2020-02-06 DIAGNOSIS — O09899 Supervision of other high risk pregnancies, unspecified trimester: Secondary | ICD-10-CM

## 2020-02-06 DIAGNOSIS — O24419 Gestational diabetes mellitus in pregnancy, unspecified control: Secondary | ICD-10-CM

## 2020-02-06 DIAGNOSIS — Z148 Genetic carrier of other disease: Secondary | ICD-10-CM

## 2020-02-06 DIAGNOSIS — Z3493 Encounter for supervision of normal pregnancy, unspecified, third trimester: Secondary | ICD-10-CM

## 2020-02-06 NOTE — Progress Notes (Signed)
I connected with Christine Bailey 02/06/20 at  9:15 AM EDT by: MyChart video and verified that I am speaking with the correct person using two identifiers.  Patient is located at home and provider is located at [facility].     The purpose of this virtual visit is to provide medical care while limiting exposure to the novel coronavirus. I discussed the limitations, risks, security and privacy concerns of performing an evaluation and management service by MyChart video and the availability of in person appointments. I also discussed with the patient that there may be a patient responsible charge related to this service. By engaging in this virtual visit, you consent to the provision of healthcare.  Additionally, you authorize for your insurance to be billed for the services provided during this visit.  The patient expressed understanding and agreed to proceed.  The following staff members participated in the virtual visit:  Corinda Gubler, CMA    PRENATAL VISIT NOTE  Subjective:  Christine Bailey is a 24 y.o. G2P1001 at [redacted]w[redacted]d  for phone visit for ongoing prenatal care.  She is currently monitored for the following issues for this low-risk pregnancy and has Eczema; Postpartum exam; Supervision of low-risk pregnancy; Short interval between pregnancies affecting pregnancy, antepartum; Genetic carrier; and Gestational diabetes on their problem list.  Patient reports no complaints.  Contractions: Not present. Vag. Bleeding: None.  Movement: Present. Denies leaking of fluid.   The following portions of the patient's history were reviewed and updated as appropriate: allergies, current medications, past family history, past medical history, past social history, past surgical history and problem list.   Objective:   Vitals:   02/06/20 0943  BP: 123/70  Pulse: (!) 108   Self-Obtained  Fetal Status:     Movement: Present     Assessment and Plan:  Pregnancy: G2P1001 at [redacted]w[redacted]d 1. Gestational diabetes mellitus (GDM),  antepartum, gestational diabetes method of control unspecified - Fasting < 95 - PP CBG 110-115 - Had Diabetes Ed appointment  - Advised to continue diet and checking CBGs QID and bring log to all appointments   2. Encounter for supervision of low-risk pregnancy in third trimester - [place] from 01/22/20 reviewed  3. Short interval between pregnancies affecting pregnancy, antepartum  4. 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  Return in about 2 weeks (around 02/20/2020) for LOB, In-Person.  No future appointments.   Time spent on virtual visit: 15 minutes  Vonzella Nipple, PA-C

## 2020-02-06 NOTE — Patient Instructions (Addendum)
Fetal Movement Counts Patient Name: ________________________________________________ Patient Due Date: ____________________ What is a fetal movement count?  A fetal movement count is the number of times that you feel your baby move during a certain amount of time. This may also be called a fetal kick count. A fetal movement count is recommended for every pregnant woman. You may be asked to start counting fetal movements as early as week 28 of your pregnancy. Pay attention to when your baby is most active. You may notice your baby's sleep and wake cycles. You may also notice things that make your baby move more. You should do a fetal movement count:  When your baby is normally most active.  At the same time each day. A good time to count movements is while you are resting, after having something to eat and drink. How do I count fetal movements? 1. Find a quiet, comfortable area. Sit, or lie down on your side. 2. Write down the date, the start time and stop time, and the number of movements that you felt between those two times. Take this information with you to your health care visits. 3. Write down your start time when you feel the first movement. 4. Count kicks, flutters, swishes, rolls, and jabs. You should feel at least 10 movements. 5. You may stop counting after you have felt 10 movements, or if you have been counting for 2 hours. Write down the stop time. 6. If you do not feel 10 movements in 2 hours, contact your health care provider for further instructions. Your health care provider may want to do additional tests to assess your baby's well-being. Contact a health care provider if:  You feel fewer than 10 movements in 2 hours.  Your baby is not moving like he or she usually does. Date: ____________ Start time: ____________ Stop time: ____________ Movements: ____________ Date: ____________ Start time: ____________ Stop time: ____________ Movements: ____________ Date: ____________  Start time: ____________ Stop time: ____________ Movements: ____________ Date: ____________ Start time: ____________ Stop time: ____________ Movements: ____________ Date: ____________ Start time: ____________ Stop time: ____________ Movements: ____________ Date: ____________ Start time: ____________ Stop time: ____________ Movements: ____________ Date: ____________ Start time: ____________ Stop time: ____________ Movements: ____________ Date: ____________ Start time: ____________ Stop time: ____________ Movements: ____________ Date: ____________ Start time: ____________ Stop time: ____________ Movements: ____________ This information is not intended to replace advice given to you by your health care provider. Make sure you discuss any questions you have with your health care provider. Document Revised: 11/24/2018 Document Reviewed: 11/24/2018 Elsevier Patient Education  2020 [employer] Braxton Hicks Contractions Contractions of the uterus can occur throughout pregnancy, but they are not always a sign that you are in labor. You may have practice contractions called Braxton Hicks contractions. These false labor contractions are sometimes confused with true labor. What are Braxton Hicks contractions? Braxton Hicks contractions are tightening movements that occur in the muscles of the uterus before labor. Unlike true labor contractions, these contractions do not result in opening (dilation) and thinning of the cervix. Toward the end of pregnancy (32-34 weeks), Braxton Hicks contractions can happen more often and may become stronger. These contractions are sometimes difficult to tell apart from true labor because they can be very uncomfortable. You should not feel embarrassed if you go to the hospital with false labor. Sometimes, the only way to tell if you are in true labor is for your health care provider to look for changes in the cervix. The health care provider   will do a physical exam and may  monitor your contractions. If you are not in true labor, the exam should show that your cervix is not dilating and your water has not broken. If there are no other health problems associated with your pregnancy, it is completely safe for you to be sent home with false labor. You may continue to have Braxton Hicks contractions until you go into true labor. How to tell the difference between true labor and false labor True labor  Contractions last 30-70 seconds.  Contractions become very regular.  Discomfort is usually felt in the top of the uterus, and it spreads to the lower abdomen and low back.  Contractions do not go away with walking.  Contractions usually become more intense and increase in frequency.  The cervix dilates and gets thinner. False labor  Contractions are usually shorter and not as strong as true labor contractions.  Contractions are usually irregular.  Contractions are often felt in the front of the lower abdomen and in the groin.  Contractions may go away when you walk around or change positions while lying down.  Contractions get weaker and are shorter-lasting as time goes on.  The cervix usually does not dilate or become thin. Follow these instructions at home:   Take over-the-counter and prescription medicines only as told by your health care provider.  Keep up with your usual exercises and follow other instructions from your health care provider.  Eat and drink lightly if you think you are going into labor.  If Braxton Hicks contractions are making you uncomfortable: ? Change your position from lying down or resting to walking, or change from walking to resting. ? Sit and rest in a tub of warm water. ? Drink enough fluid to keep your urine pale yellow. Dehydration may cause these contractions. ? Do slow and deep breathing several times an hour.  Keep all follow-up prenatal visits as told by your health care provider. This is important. Contact a  health care provider if:  You have a fever.  You have continuous pain in your abdomen. Get help right away if:  Your contractions become stronger, more regular, and closer together.  You have fluid leaking or gushing from your vagina.  You pass blood-tinged mucus (bloody show).  You have bleeding from your vagina.  You have low back pain that you never had before.  You feel your baby's head pushing down and causing pelvic pressure.  Your baby is not moving inside you as much as it used to. Summary  Contractions that occur before labor are called Braxton Hicks contractions, false labor, or practice contractions.  Braxton Hicks contractions are usually shorter, weaker, farther apart, and less regular than true labor contractions. True labor contractions usually become progressively stronger and regular, and they become more frequent.  Manage discomfort from [facility] contractions by changing position, resting in a warm bath, drinking plenty of water, or practicing deep breathing. This information is not intended to replace advice given to you by your health care provider. Make sure you discuss any questions you have with your health care provider. Document Revised: 03/19/2017 Document Reviewed: 08/20/2016 Elsevier Patient Education  2020 [employer].   Safe Medications in Pregnancy   Acne:  Benzoyl Peroxide  Salicylic Acid   Backache/Headache:  Tylenol: 2 regular strength every 4 hours OR        2 Extra strength every 6 hours   Colds/Coughs/Allergies:  Benadryl (alcohol free) 25 mg every 6 hours  needed  Breath right strips  Claritin  Cepacol throat lozenges  Chloraseptic throat spray  Cold-Eeze- up to three times per day  Cough drops, alcohol free  Flonase (by prescription only)  Guaifenesin  Mucinex  Robitussin DM (plain only, alcohol free)  Saline nasal spray/drops  Sudafed (pseudoephedrine) & Actifed * use only after [redacted] weeks gestation and if you  do not have high blood pressure  Tylenol  Vicks Vaporub  Zinc lozenges  Zyrtec   Constipation:  Colace  Ducolax suppositories  Fleet enema  Glycerin suppositories  Metamucil  Milk of magnesia  Miralax  Senokot  Smooth move tea   Diarrhea:  Kaopectate  Imodium A-D   *NO pepto Bismol   Hemorrhoids:  Anusol  Anusol HC  Preparation H  Tucks   Indigestion:  Tums  Maalox  Mylanta  Zantac  Pepcid   Insomnia:  Benadryl (alcohol free) 25mg every 6 hours as needed  Tylenol PM  Unisom, no Gelcaps   Leg Cramps:  Tums  MagGel   Nausea/Vomiting:  Bonine  Dramamine  Emetrol  Ginger extract  Sea bands  Meclizine  Nausea medication to take during pregnancy:  Unisom (doxylamine succinate 25 mg tablets) Take one tablet daily at bedtime. If symptoms are not adequately controlled, the dose can be increased to a maximum recommended dose of two tablets daily (1/2 tablet in the morning, 1/2 tablet mid-afternoon and one at bedtime).  Vitamin B6 100mg tablets. Take one tablet twice a day (up to 200 mg per day).   Skin Rashes:  Aveeno products  Benadryl cream or 25mg every 6 hours as needed  Calamine Lotion  1% cortisone cream   Yeast infection:  Gyne-lotrimin 7  Monistat 7    **If taking multiple medications, please check labels to avoid duplicating the same active ingredients  **take medication as directed on the label  ** Do not exceed 4000 mg of tylenol in 24 hours  **Do not take medications that contain aspirin or ibuprofen          

## 2020-02-08 ENCOUNTER — Other Ambulatory Visit: Payer: Self-pay | Admitting: *Deleted

## 2020-02-08 DIAGNOSIS — O24419 Gestational diabetes mellitus in pregnancy, unspecified control: Secondary | ICD-10-CM

## 2020-02-09 ENCOUNTER — Other Ambulatory Visit: Payer: Self-pay

## 2020-02-20 ENCOUNTER — Ambulatory Visit (INDEPENDENT_AMBULATORY_CARE_PROVIDER_SITE_OTHER): Payer: Medicaid Other | Admitting: Nurse Practitioner

## 2020-02-20 ENCOUNTER — Encounter: Payer: Self-pay | Admitting: Nurse Practitioner

## 2020-02-20 ENCOUNTER — Other Ambulatory Visit: Payer: Self-pay

## 2020-02-20 VITALS — BP 127/68 | HR 104 | Wt 214.0 lb

## 2020-02-20 DIAGNOSIS — O09899 Supervision of other high risk pregnancies, unspecified trimester: Secondary | ICD-10-CM

## 2020-02-20 DIAGNOSIS — Z7189 Other specified counseling: Secondary | ICD-10-CM | POA: Diagnosis not present

## 2020-02-20 DIAGNOSIS — O24419 Gestational diabetes mellitus in pregnancy, unspecified control: Secondary | ICD-10-CM

## 2020-02-20 DIAGNOSIS — Z3A32 32 weeks gestation of pregnancy: Secondary | ICD-10-CM

## 2020-02-20 DIAGNOSIS — O0993 Supervision of high risk pregnancy, unspecified, third trimester: Secondary | ICD-10-CM

## 2020-02-20 DIAGNOSIS — O0990 Supervision of high risk pregnancy, unspecified, unspecified trimester: Secondary | ICD-10-CM

## 2020-02-20 NOTE — Patient Instructions (Addendum)
TDaP Vaccine Pregnancy Get the Whooping Cough Vaccine While You Are Pregnant (CDC)  It is important for women to get the whooping cough vaccine in the third trimester of each pregnancy. Vaccines are the best way to prevent this disease. There are 2 different whooping cough vaccines. Both vaccines combine protection against whooping cough, tetanus and diphtheria, but they are for different age groups: Tdap: for everyone 11 years or older, including pregnant women  DTaP: for children 2 months through 6 years of age  You need the whooping cough vaccine during each of your pregnancies The recommended time to get the shot is during your 27th through 36th week of pregnancy, preferably during the earlier part of this time period. The Centers for Disease Control and Prevention (CDC) recommends that pregnant women receive the whooping cough vaccine for adolescents and adults (called Tdap vaccine) during the third trimester of each pregnancy. The recommended time to get the shot is during your 27th through 36th week of pregnancy, preferably during the earlier part of this time period. This replaces the original recommendation that pregnant women get the vaccine only if they had not previously received it. The American College of Obstetricians and Gynecologists and the American College of Nurse-Midwives support this recommendation.  You should get the whooping cough vaccine while pregnant to pass protection to your baby frame support disabled and/or not supported in this browser  Learn why Christine Bailey decided to get the whooping cough vaccine in her 3rd trimester of pregnancy and how her baby girl was born with some protection against the disease. Also available on YouTube. After receiving the whooping cough vaccine, your body will create protective antibodies (proteins produced by the body to fight off diseases) and pass some of them to your baby before birth. These antibodies provide your baby some short-term  protection against whooping cough in early life. These antibodies can also protect your baby from some of the more serious complications that come along with whooping cough. Your protective antibodies are at their highest about 2 weeks after getting the vaccine, but it takes time to pass them to your baby. So the preferred time to get the whooping cough vaccine is early in your third trimester. The amount of whooping cough antibodies in your body decreases over time. That is why CDC recommends you get a whooping cough vaccine during each pregnancy. Doing so allows each of your babies to get the greatest number of protective antibodies from you. This means each of your babies will get the best protection possible against this disease.  Getting the whooping cough vaccine while pregnant is better than getting the vaccine after you give birth Whooping cough vaccination during pregnancy is ideal so your baby will have short-term protection as soon as he is born. This early protection is important because your baby will not start getting his whooping cough vaccines until he is 2 months old. These first few months of life are when your baby is at greatest risk for catching whooping cough. This is also when he's at greatest risk for having severe, potentially life-threating complications from the infection. To avoid that gap in protection, it is best to get a whooping cough vaccine during pregnancy. You will then pass protection to your baby before he is born. To continue protecting your baby, he should get whooping cough vaccines starting at 2 months old. You may never have gotten the Tdap vaccine before and did not get it during this pregnancy. If so, you should make sure   to get the vaccine immediately after you give birth, before leaving the hospital or birthing center. It will take about 2 weeks before your body develops protection (antibodies) in response to the vaccine. Once you have protection from the vaccine,  you are less likely to give whooping cough to your newborn while caring for him. But remember, your baby will still be at risk for catching whooping cough from others. A recent study looked to see how effective Tdap was at preventing whooping cough in babies whose mothers got the vaccine while pregnant or in the hospital after giving birth. The study found that getting Tdap between 27 through 36 weeks of pregnancy is 85% more effective at preventing whooping cough in babies younger than 2 months old. Blood tests cannot tell if you need a whooping cough vaccine There are no blood tests that can tell you if you have enough antibodies in your body to protect yourself or your baby against whooping cough. Even if you have been sick with whooping cough in the past or previously received the vaccine, you still should get the vaccine during each pregnancy. Breastfeeding may pass some protective antibodies onto your baby By breastfeeding, you may pass some antibodies you have made in response to the vaccine to your baby. When you get a whooping cough vaccine during your pregnancy, you will have antibodies in your breast milk that you can share with your baby as soon as your milk comes in. However, your baby will not get protective antibodies immediately if you wait to get the whooping cough vaccine until after delivering your baby. This is because it takes about 2 weeks for your body to create antibodies. Learn more about the health benefits of breastfeeding.    Check out Covid vaccine info here:   [email or web address]  7 Things You Should Know About the COVID-19 Vaccines  Here are seven facts you should know before taking your shot:  1.  No serious side effects were reported in clinical trials. Temporary reactions after receiving the vaccine may include a sore arm, headache, feeling tired and achy for a day or two or, in some cases, fever. In most cases, these reactions are good signs that your  body is building protection.   2.  Scientists had a head start. They are built on decades of research on vaccines for similar viruses. A big investment of resources and focus made sure they were created without skipping any steps in development, testing, or clinical trials.  3. You cannot get COVID-19 from the vaccine. The vaccine gives your body instructions to make a protein that safely teaches you to make germ-fighting antibodies to fight the real COVID-19.  4.The vaccine protects against the Delta variant. The Delta variant, which is now predominant in [place], is much more contagious than the original virus. Vaccines continue to be remarkably effective in reducing risk of severe disease, hospitalization, and death, even against the Delta variant.  5. A hundred million people in the [place] have already received their COVID-19 vaccine.  6. It works. And once you're fully vaccinated you're protected. The vaccines are proven to help prevent COVID-19 and are effective in preventing hospitalization and death.   7. The vaccine does not affect fertility. Vaccination for those who are pregnant or wanting to become pregnant is recommended by the [employer] of Obstetricians and Gynecologists (ACOG), the Society for Maternal-Fetal Medicine (SMFM), the American Society for Reproductive Medicine (ASRM), and the Society for Female Reproduction and Urology.

## 2020-02-20 NOTE — Progress Notes (Signed)
    Subjective:  Christine Bailey is a 24 y.o. G2P1001 at [redacted]w[redacted]d being seen today for ongoing prenatal care.  She is currently monitored for the following issues for this high-risk pregnancy and has Eczema; Postpartum exam; Supervision of high risk pregnancy, antepartum; Short interval between pregnancies affecting pregnancy, antepartum; Genetic carrier; and Gestational diabetes on their problem list.  Patient reports no complaints.  Contractions: Not present. Vag. Bleeding: None.  Movement: Present. Denies leaking of fluid.   The following portions of the patient's history were reviewed and updated as appropriate: allergies, current medications, past family history, past medical history, past social history, past surgical history and problem list. Problem list updated.  Objective:   Vitals:   02/20/20 1109  BP: 127/68  Pulse: (!) 104  Weight: 214 lb (97.1 kg)    Fetal Status: Fetal Heart Rate (bpm): 152 Fundal Height: 34 cm Movement: Present     General:  Alert, oriented and cooperative. Patient is in no acute distress.  Skin: Skin is warm and dry. No rash noted.   Cardiovascular: Normal heart rate noted  Respiratory: Normal respiratory effort, no problems with respiration noted  Abdomen: Soft, gravid, appropriate for gestational age. Pain/Pressure: Absent     Pelvic:  Cervical exam deferred        Extremities: Normal range of motion.  Edema: None  Mental Status: Normal mood and affect. Normal behavior. Normal judgment and thought content.   Urinalysis:      Assessment and Plan:  Pregnancy: G2P1001 at [redacted]w[redacted]d  1. Encounter for supervision of high risk pregnancy in third trimester, antepartum Babyscripts is still active from her previous pregnancy - not able to add BP Reviewed info on TDAP and why it is recommended in pregnancy Recommended flu vaccine and advised that all 3 - covid, flu and TDAP can be given together without a problem. Advised Covid vaccine can be given at [facility] or  [facility]  2. Gestational diabetes mellitus (GDM), antepartum, gestational diabetes method of control unspecified Did not bring recording sheet for gestational diabetes but reports that fasting readings have all been under 95 and after meals recordings have all been under 120.  COVID-19 Vaccine Counseling: The patient was counseled on the potential benefits and lack of known risks of COVID vaccination, during pregnancy and breastfeeding, during today's visit. The patient's questions and concerns were addressed today, including safety of the vaccination and potential side effects as they have been published by ACOG and SMFM. The patient has been informed that there have not been any documented vaccine related injuries, deaths or birth defects to infant or mom after receiving the COVID-19 vaccine to date. The patient has been made aware that although she is not at increased risk of contracting COVID-19 during pregnancy, she is at increased risk of developing severe disease and complications if she contracts COVID-19 while pregnant. All patient questions were addressed during our visit today. The patient is planning to get vaccinated.   Preterm labor symptoms and general obstetric precautions including but not limited to vaginal bleeding, contractions, leaking of fluid and fetal movement were reviewed in detail with the patient. Please refer to After Visit Summary for other counseling recommendations.  Return in about 2 weeks (around 03/05/2020) for in person ROB.  Nolene Bernheim, RN, MSN, NP-BC Nurse Practitioner, [facility] for [employer], [facility] 02/20/2020 11:53 AM

## 2020-03-05 ENCOUNTER — Ambulatory Visit (INDEPENDENT_AMBULATORY_CARE_PROVIDER_SITE_OTHER): Payer: Medicaid Other | Admitting: Nurse Practitioner

## 2020-03-05 ENCOUNTER — Other Ambulatory Visit: Payer: Self-pay

## 2020-03-05 VITALS — BP 113/77 | HR 92 | Wt 212.7 lb

## 2020-03-05 DIAGNOSIS — Z23 Encounter for immunization: Secondary | ICD-10-CM | POA: Diagnosis not present

## 2020-03-05 DIAGNOSIS — O0990 Supervision of high risk pregnancy, unspecified, unspecified trimester: Secondary | ICD-10-CM | POA: Diagnosis not present

## 2020-03-05 DIAGNOSIS — Z3A30 30 weeks gestation of pregnancy: Secondary | ICD-10-CM

## 2020-03-05 DIAGNOSIS — O24419 Gestational diabetes mellitus in pregnancy, unspecified control: Secondary | ICD-10-CM

## 2020-03-05 NOTE — Progress Notes (Signed)
Pt has Glucose paper log for got to bring today.

## 2020-03-05 NOTE — Progress Notes (Signed)
    Subjective:  Christine Bailey is a 24 y.o. G2P1001 at [redacted]w[redacted]d being seen today for ongoing prenatal care.  She is currently monitored for the following issues for this high-risk pregnancy and has Eczema; Postpartum exam; Supervision of high risk pregnancy, antepartum; Short interval between pregnancies affecting pregnancy, antepartum; Genetic carrier; and Gestational diabetes on their problem list.  Patient reports some pelvic pain when turning over in bed at night - midline.  No problems with walking or standing on one leg..  Contractions: Not present. Vag. Bleeding: None.  Movement: Present. Denies leaking of fluid.   The following portions of the patient's history were reviewed and updated as appropriate: allergies, current medications, past family history, past medical history, past social history, past surgical history and problem list. Problem list updated.  Objective:   Vitals:   03/05/20 1125  BP: 113/77  Pulse: 92  Weight: 212 lb 11.2 oz (96.5 kg)    Fetal Status: Fetal Heart Rate (bpm): 155 Fundal Height: 37 cm Movement: Present     General:  Alert, oriented and cooperative. Patient is in no acute distress.  Skin: Skin is warm and dry. No rash noted.   Cardiovascular: Normal heart rate noted  Respiratory: Normal respiratory effort, no problems with respiration noted  Abdomen: Soft, gravid, appropriate for gestational age. Pain/Pressure: Present     Pelvic:  Cervical exam deferred        Extremities: Normal range of motion.  Edema: None  Mental Status: Normal mood and affect. Normal behavior. Normal judgment and thought content.   Urinalysis:      Assessment and Plan:  Pregnancy: G2P1001 at [redacted]w[redacted]d  1. Supervision of high risk pregnancy, antepartum [place] ordered in 2 weeks Next appt will include vaginal swabs Plans on getting the Covid vaccine Getting TDAP today  2. Gestational diabetes mellitus (GDM), antepartum, gestational diabetes method of control unspecified Did not  bring written record but reports fasting under 95 and after meals under 120  - [place] MFM OB FOLLOW UP; Future    Preterm labor symptoms and general obstetric precautions including but not limited to vaginal bleeding, contractions, leaking of fluid and fetal movement were reviewed in detail with the patient. Please refer to After Visit Summary for other counseling recommendations.  Return in about 2 weeks (around 03/19/2020) for in person ROB for vaginal swabs.  Nolene Bernheim, RN, MSN, NP-BC Nurse Practitioner, [facility] for [employer], [facility] 03/05/2020 11:41 AM

## 2020-03-19 ENCOUNTER — Other Ambulatory Visit (HOSPITAL_COMMUNITY)
Admission: RE | Admit: 2020-03-19 | Discharge: 2020-03-19 | Disposition: A | Discharge status: Home / Self Care | Payer: Medicaid Other | Source: Ambulatory Visit | Attending: Obstetrics and Gynecology | Admitting: Obstetrics and Gynecology

## 2020-03-19 ENCOUNTER — Encounter: Payer: Self-pay | Admitting: Obstetrics and Gynecology

## 2020-03-19 ENCOUNTER — Ambulatory Visit (INDEPENDENT_AMBULATORY_CARE_PROVIDER_SITE_OTHER): Payer: Medicaid Other | Admitting: Obstetrics and Gynecology

## 2020-03-19 ENCOUNTER — Other Ambulatory Visit: Payer: Self-pay

## 2020-03-19 VITALS — BP 136/77 | HR 104 | Wt 221.0 lb

## 2020-03-19 DIAGNOSIS — O24419 Gestational diabetes mellitus in pregnancy, unspecified control: Secondary | ICD-10-CM

## 2020-03-19 DIAGNOSIS — R87612 Low grade squamous intraepithelial lesion on cytologic smear of cervix (LGSIL): Secondary | ICD-10-CM

## 2020-03-19 DIAGNOSIS — Z148 Genetic carrier of other disease: Secondary | ICD-10-CM

## 2020-03-19 DIAGNOSIS — O0990 Supervision of high risk pregnancy, unspecified, unspecified trimester: Secondary | ICD-10-CM

## 2020-03-19 DIAGNOSIS — O09899 Supervision of other high risk pregnancies, unspecified trimester: Secondary | ICD-10-CM

## 2020-03-19 DIAGNOSIS — Z3A36 36 weeks gestation of pregnancy: Secondary | ICD-10-CM

## 2020-03-19 NOTE — Patient Instructions (Addendum)
Third Trimester of Pregnancy The third trimester is from week 28 through week 40 (months 7 through 9). The third trimester is a time when the unborn baby (fetus) is growing rapidly. At the end of the ninth month, the fetus is about 20 inches in length and weighs 6-10 pounds. Body changes during your third trimester Your body will continue to go through many changes during pregnancy. The changes vary from woman to woman. During the third trimester:  Your weight will continue to increase. You can expect to gain 25-35 pounds (11-16 kg) by the end of the pregnancy.  You may begin to get stretch marks on your hips, abdomen, and breasts.  You may urinate more often because the fetus is moving lower into your pelvis and pressing on your bladder.  You may develop or continue to have heartburn. This is caused by increased hormones that slow down muscles in the digestive tract.  You may develop or continue to have constipation because increased hormones slow digestion and cause the muscles that push waste through your intestines to relax.  You may develop hemorrhoids. These are swollen veins (varicose veins) in the rectum that can itch or be painful.  You may develop swollen, bulging veins (varicose veins) in your legs.  You may have increased body aches in the pelvis, back, or thighs. This is due to weight gain and increased hormones that are relaxing your joints.  You may have changes in your hair. These can include thickening of your hair, rapid growth, and changes in texture. Some women also have hair loss during or after pregnancy, or hair that feels dry or thin. Your hair will most likely return to normal after your baby is born.  Your breasts will continue to grow and they will continue to become tender. A yellow fluid (colostrum) may leak from your breasts. This is the first milk you are producing for your baby.  Your belly button may stick out.  You may notice more swelling in your hands,  face, or ankles.  You may have increased tingling or numbness in your hands, arms, and legs. The skin on your belly may also feel numb.  You may feel short of breath because of your expanding uterus.  You may have more problems sleeping. This can be caused by the size of your belly, increased need to urinate, and an increase in your body's metabolism.  You may notice the fetus "dropping," or moving lower in your abdomen (lightening).  You may have increased vaginal discharge.  You may notice your joints feel loose and you may have pain around your pelvic bone. What to expect at prenatal visits You will have prenatal exams every 2 weeks until week 36. Then you will have weekly prenatal exams. During a routine prenatal visit:  You will be weighed to make sure you and the baby are growing normally.  Your blood pressure will be taken.  Your abdomen will be measured to track your baby's growth.  The fetal heartbeat will be listened to.  Any test results from the previous visit will be discussed.  You may have a cervical check near your due date to see if your cervix has softened or thinned (effaced).  You will be tested for Group B streptococcus. This happens between 35 and 37 weeks. Your health care provider may ask you:  What your birth plan is.  How you are feeling.  If you are feeling the baby move.  If you have had any abnormal   symptoms, such as leaking fluid, bleeding, severe headaches, or abdominal cramping.  If you are using any tobacco products, including cigarettes, chewing tobacco, and electronic cigarettes.  If you have any questions. Other tests or screenings that may be performed during your third trimester include:  Blood tests that check for low iron levels (anemia).  Fetal testing to check the health, activity level, and growth of the fetus. Testing is done if you have certain medical conditions or if there are problems during the pregnancy.  Nonstress test  (NST). This test checks the health of your baby to make sure there are no signs of problems, such as the baby not getting enough oxygen. During this test, a belt is placed around your belly. The baby is made to move, and its heart rate is monitored during movement. What is false labor? False labor is a condition in which you feel small, irregular tightenings of the muscles in the womb (contractions) that usually go away with rest, changing position, or drinking water. These are called Braxton Hicks contractions. Contractions may last for hours, days, or even weeks before true labor sets in. If contractions come at regular intervals, become more frequent, increase in intensity, or become painful, you should see your health care provider. What are the signs of labor?  Abdominal cramps.  Regular contractions that start at 10 minutes apart and become stronger and more frequent with time.  Contractions that start on the top of the uterus and spread down to the lower abdomen and back.  Increased pelvic pressure and dull back pain.  A watery or bloody mucus discharge that comes from the vagina.  Leaking of amniotic fluid. This is also known as your "water breaking." It could be a slow trickle or a gush. Let your health care provider know if it has a color or strange odor. If you have any of these signs, call your health care provider right away, even if it is before your due date. Follow these instructions at home: Medicines  Follow your health care provider's instructions regarding medicine use. Specific medicines may be either safe or unsafe to take during pregnancy.  Take a prenatal vitamin that contains at least 600 micrograms (mcg) of folic acid.  If you develop constipation, try taking a stool softener if your health care provider approves. Eating and drinking   Eat a balanced diet that includes fresh fruits and vegetables, whole grains, good sources of protein such as meat, eggs, or tofu,  and low-fat dairy. Your health care provider will help you determine the amount of weight gain that is right for you.  Avoid raw meat and uncooked cheese. These carry germs that can cause birth defects in the baby.  If you have low calcium intake from food, talk to your health care provider about whether you should take a daily calcium supplement.  Eat four or five small meals rather than three large meals a day.  Limit foods that are high in fat and processed sugars, such as fried and sweet foods.  To prevent constipation: ? Drink enough fluid to keep your urine clear or pale yellow. ? Eat foods that are high in fiber, such as fresh fruits and vegetables, whole grains, and beans. Activity  Exercise only as directed by your health care provider. Most women can continue their usual exercise routine during pregnancy. Try to exercise for 30 minutes at least 5 days a week. Stop exercising if you experience uterine contractions.  Avoid heavy lifting.  Do   not exercise in extreme heat or humidity, or at high altitudes.  Wear low-heel, comfortable shoes.  Practice good posture.  You may continue to have sex unless your health care provider tells you otherwise. Relieving pain and discomfort  Take frequent breaks and rest with your legs elevated if you have leg cramps or low back pain.  Take warm sitz baths to soothe any pain or discomfort caused by hemorrhoids. Use hemorrhoid cream if your health care provider approves.  Wear a good support bra to prevent discomfort from breast tenderness.  If you develop varicose veins: ? Wear support pantyhose or compression stockings as told by your healthcare provider. ? Elevate your feet for 15 minutes, 3-4 times a day. Prenatal care  Write down your questions. Take them to your prenatal visits.  Keep all your prenatal visits as told by your health care provider. This is important. Safety  Wear your seat belt at all times when driving.  Make  a list of emergency phone numbers, including numbers for family, friends, the hospital, and police and fire departments. General instructions  Avoid cat litter boxes and soil used by cats. These carry germs that can cause birth defects in the baby. If you have a cat, ask someone to clean the litter box for you.  Do not travel far distances unless it is absolutely necessary and only with the approval of your health care provider.  Do not use hot tubs, steam rooms, or saunas.  Do not drink alcohol.  Do not use any products that contain nicotine or tobacco, such as cigarettes and e-cigarettes. If you need help quitting, ask your health care provider.  Do not use any medicinal herbs or unprescribed drugs. These chemicals affect the formation and growth of the baby.  Do not douche or use tampons or scented sanitary pads.  Do not cross your legs for long periods of time.  To prepare for the arrival of your baby: ? Take prenatal classes to understand, practice, and ask questions about labor and delivery. ? Make a trial run to the hospital. ? Visit the hospital and tour the maternity area. ? Arrange for maternity or paternity leave through employers. ? Arrange for family and friends to take care of pets while you are in the hospital. ? Purchase a rear-facing car seat and make sure you know how to install it in your car. ? Pack your hospital bag. ? Prepare the baby's nursery. Make sure to remove all pillows and stuffed animals from the baby's crib to prevent suffocation.  Visit your dentist if you have not gone during your pregnancy. Use a soft toothbrush to brush your teeth and be gentle when you floss. Contact a health care provider if:  You are unsure if you are in labor or if your water has broken.  You become dizzy.  You have mild pelvic cramps, pelvic pressure, or nagging pain in your abdominal area.  You have lower back pain.  You have persistent nausea, vomiting, or  diarrhea.  You have an unusual or bad smelling vaginal discharge.  You have pain when you urinate. Get help right away if:  Your water breaks before 37 weeks.  You have regular contractions less than 5 minutes apart before 37 weeks.  You have a fever.  You are leaking fluid from your vagina.  You have spotting or bleeding from your vagina.  You have severe abdominal pain or cramping.  You have rapid weight loss or weight gain.  You have   shortness of breath with chest pain.  You notice sudden or extreme swelling of your face, hands, ankles, feet, or legs.  Your baby makes fewer than 10 movements in 2 hours.  You have severe headaches that do not go away when you take medicine.  You have vision changes. Summary  The third trimester is from week 28 through week 40, months 7 through 9. The third trimester is a time when the unborn baby (fetus) is growing rapidly.  During the third trimester, your discomfort may increase as you and your baby continue to gain weight. You may have abdominal, leg, and back pain, sleeping problems, and an increased need to urinate.  During the third trimester your breasts will keep growing and they will continue to become tender. A yellow fluid (colostrum) may leak from your breasts. This is the first milk you are producing for your baby.  False labor is a condition in which you feel small, irregular tightenings of the muscles in the womb (contractions) that eventually go away. These are called Braxton Hicks contractions. Contractions may last for hours, days, or even weeks before true labor sets in.  Signs of labor can include: abdominal cramps; regular contractions that start at 10 minutes apart and become stronger and more frequent with time; watery or bloody mucus discharge that comes from the vagina; increased pelvic pressure and dull back pain; and leaking of amniotic fluid. This information is not intended to replace advice given to you by your  health care provider. Make sure you discuss any questions you have with your health care provider. Document Revised: 07/28/2018 Document Reviewed: 05/12/2016 Elsevier Patient Education  2020 [employer].   Intrauterine Device Information An intrauterine device (IUD) is a medical device that is inserted in the uterus to prevent pregnancy. It is a small, T-shaped device that has one or two nylon strings hanging down from it. The strings hang out of the lower part of the uterus (cervix) to allow for future IUD removal. There are two types of IUDs available:  Hormone IUD. This type of IUD is made of plastic and contains the hormone progestin (synthetic progesterone). A hormone IUD may last 3-5 years.  Copper IUD. This type of IUD has copper wire wrapped around it. A copper IUD may last up to 10 years. How is an IUD inserted? An IUD is inserted through the vagina and placed into the uterus with a minor medical procedure. The exact procedure for IUD insertion may vary among health care providers and hospitals. How does an IUD work? Synthetic progesterone in a hormonal IUD prevents pregnancy by:  Thickening cervical mucus to prevent sperm from entering the uterus.  Thinning the uterine lining to prevent a fertilized egg from being implanted there. Copper in a copper IUD prevents pregnancy by making the uterus and fallopian tubes produce a fluid that kills sperm. What are the advantages of an IUD? Advantages of either type of IUD  It is highly effective in preventing pregnancy.  It is reversible. You can become pregnant shortly after the IUD is removed.  It is low-maintenance and can stay in place for a long time.  There are no estrogen-related side effects.  It can be used when breastfeeding.  It is not associated with weight gain.  It can be inserted right after childbirth, an abortion, or a miscarriage. Advantages of a hormone IUD  If it is inserted within 7 days of your period  starting, it works right after it is inserted. If  the hormone IUD is inserted at any other time in your cycle, you will need to use a backup method of birth control for 7 days after insertion.  It can make menstrual periods lighter.  It can reduce menstrual cramping.  It can be used for 3-5 years. Advantages of a copper IUD  It works right after it is inserted.  It can be used as a form of emergency birth control if it is inserted within 5 days after having unprotected sex.  It does not interfere with your body's natural hormones.  It can be used for 10 years. What are the disadvantages of an IUD?  An IUD may cause irregular menstrual bleeding for a period of time after insertion.  You may have pain during insertion and have cramping and vaginal bleeding after insertion.  An IUD may cut the uterus (uterine perforation) when it is inserted. This is rare.  An IUD may cause pelvic inflammatory disease (PID), which is an infection in the uterus and fallopian tubes. This is rare, and it usually happens during the first 20 days after the IUD is inserted.  A copper IUD can make your menstrual flow heavier and more painful. How is an IUD removed?  You will lie on your back with your knees bent and your feet in footrests (stirrups).  A device will be inserted into your vagina to spread apart the vaginal walls (speculum). This will allow your health care provider to see the strings attached to the IUD.  Your health care provider will use a small instrument (forceps) to grasp the IUD strings and pull firmly until the IUD is removed. You may have some discomfort when the IUD is removed. Your health care provider may recommend taking over-the-counter pain relievers, such as ibuprofen, before the procedure. You may also have minor spotting for a few days after the procedure. The exact procedure for IUD removal may vary among health care providers and hospitals. Is the IUD right for me? Your  health care provider will make sure you are a good candidate for an IUD and will discuss the advantages, disadvantages, and possible side effects with you. Summary  An intrauterine device (IUD) is a medical device that is inserted in the uterus to prevent pregnancy. It is a small, T-shaped device that has one or two nylon strings hanging down from it.  A hormone IUD contains the hormone progestin (synthetic progesterone). A copper IUD has copper wire wrapped around it.  Synthetic progesterone in a hormone IUD prevents pregnancy by thickening cervical mucus and thinning the walls of the uterus. Copper in a copper IUD prevents pregnancy by making the uterus and fallopian tubes produce a fluid that kills sperm.  A hormone IUD can be left in place for 3-5 years. A copper IUD can be left in place for up to 10 years.  An IUD is inserted and removed by a health care provider. You may feel some pain during insertion and removal. Your health care provider may recommend taking over-the-counter pain medicine, such as ibuprofen, before an IUD procedure. This information is not intended to replace advice given to you by your health care provider. Make sure you discuss any questions you have with your health care provider. Document Revised: 03/19/2017 Document Reviewed: 05/05/2016 Elsevier Patient Education  2020 [employer].

## 2020-03-19 NOTE — Progress Notes (Signed)
Pt did not Bring Paper Glucose log.

## 2020-03-19 NOTE — Progress Notes (Signed)
PRENATAL VISIT NOTE  Subjective:  Christine Bailey is a 24 y.o. G2P1001 at [redacted]w[redacted]d being seen today for ongoing prenatal care.  She is currently monitored for the following issues for this high-risk pregnancy and has Eczema; Postpartum exam; Supervision of high risk pregnancy, antepartum; Short interval between pregnancies affecting pregnancy, antepartum; Genetic carrier; Gestational diabetes; [redacted] weeks gestation of pregnancy; and LGSIL on Pap smear of cervix on their problem list.  Patient reports no complaints.  Contractions: Irritability. Vag. Bleeding: None.  Movement: Present. Denies leaking of fluid.  The following portions of the patient's history were reviewed and updated as appropriate: allergies, current medications, past family history, past medical history, past social history, past surgical history and problem list.   Objective:   Vitals:   03/19/20 0951  BP: 136/77  Pulse: (!) 104  Weight: 221 lb (100.2 kg)    Fetal Status: Fetal Heart Rate (bpm): 160 Fundal Height: 37 cm Movement: Present     General:  Alert, oriented and cooperative. Patient is in no acute distress.  Skin: Skin is warm and dry. No rash noted.   Cardiovascular: Normal heart rate noted  Respiratory: Normal respiratory effort, no problems with respiration noted  Abdomen: Soft, gravid, appropriate for gestational age.  Pain/Pressure: Present     Pelvic: GBS & GC/C swabs performed with chaparone present in room. Normal external genitalia.        Extremities: Normal range of motion.  Edema: None  Mental Status: Normal mood and affect. Normal behavior. Normal judgment and thought content.   Assessment and Plan:  Pregnancy: G2P1001 at [redacted]w[redacted]d 1. [redacted] weeks gestation of pregnancy, Supervision of high risk pregnancy, antepartum: No red flag symptoms. +FM and +FHTs in clinic today. Appropriate fundal height. Appropriate fetal growth at last ultrasound. - declined flu vaccine again today - continue prenatal vitamin  daily - continue checking QID BG levels as noted below (currently at goal) - GC/Chlamydia probe amp ([place])not at [facility] - Culture, beta strep (group b only) - rtc 1 week for f/u prenatal appt or sooner for return precautions as noted below  2. Short interval between pregnancies affecting pregnancy, antepartum: Pt has 29-month-old daughter at home. Does not desire future pregnancies at this time. -counseled on LARC today (plan for inpatient hormonal IUD)  3. 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 FOB has been tested and is negative.  4. Gestational diabetes mellitus (GDM), antepartum, gestational diabetes method of control unspecified: Pt did not bring her BG log again today but confidently reports that she is checking her BG levels consistently 4x daily (FBG: 94-95 & 2hr PP: 115-117). Reports all BG levels at goal. Recent growth ultrasound within normal limits (EFW 66% at [redacted]w[redacted]d).  -plan for growth scan on 12/1 as previously scheduled  -plan for IOL by [redacted]w[redacted]d based on current diagnosis of A1GDM  5. 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 No HPV testing performed. - plan for repeat pap in 09/2020 per [facility]  Preterm labor symptoms and general obstetric precautions including but not limited to vaginal bleeding, contractions, leaking of fluid and fetal movement were reviewed in detail with the patient. Please refer to After Visit Summary for other counseling recommendations.   Return in about 1 week (around 03/26/2020) for 1 week f/u prenatal appt in person, any provider.  Future Appointments  Date Time Provider Department Center  03/20/2020  1:00 PM [facility] NURSE [facility]  03/20/2020  1:15 PM [facility]  03/28/2020 10:35 AM  Malachy Chamber, MD [facility]    Sheila Oats, MD OB Fellow, [facility] 03/19/2020 1:25 PM

## 2020-03-20 ENCOUNTER — Ambulatory Visit: Payer: Medicaid Other | Admitting: *Deleted

## 2020-03-20 ENCOUNTER — Ambulatory Visit: Payer: Medicaid Other | Attending: Nurse Practitioner

## 2020-03-20 DIAGNOSIS — O09893 Supervision of other high risk pregnancies, third trimester: Secondary | ICD-10-CM

## 2020-03-20 DIAGNOSIS — Z148 Genetic carrier of other disease: Secondary | ICD-10-CM | POA: Diagnosis not present

## 2020-03-20 DIAGNOSIS — Z362 Encounter for other antenatal screening follow-up: Secondary | ICD-10-CM

## 2020-03-20 DIAGNOSIS — O09899 Supervision of other high risk pregnancies, unspecified trimester: Secondary | ICD-10-CM | POA: Diagnosis not present

## 2020-03-20 DIAGNOSIS — O24419 Gestational diabetes mellitus in pregnancy, unspecified control: Secondary | ICD-10-CM | POA: Diagnosis not present

## 2020-03-20 DIAGNOSIS — Z3A36 36 weeks gestation of pregnancy: Secondary | ICD-10-CM

## 2020-03-20 DIAGNOSIS — O0990 Supervision of high risk pregnancy, unspecified, unspecified trimester: Secondary | ICD-10-CM

## 2020-03-20 DIAGNOSIS — O24410 Gestational diabetes mellitus in pregnancy, diet controlled: Secondary | ICD-10-CM | POA: Diagnosis not present

## 2020-03-20 LAB — GC/CHLAMYDIA PROBE AMP (~~LOC~~) NOT AT ARMC
Chlamydia: NEGATIVE
Comment: NEGATIVE
Comment: NORMAL
Neisseria Gonorrhea: NEGATIVE

## 2020-03-23 LAB — CULTURE, BETA STREP (GROUP B ONLY): Strep Gp B Culture: NEGATIVE

## 2020-03-24 ENCOUNTER — Inpatient Hospital Stay (HOSPITAL_COMMUNITY)
Admission: AD | Admit: 2020-03-24 | Discharge: 2020-03-24 | Disposition: A | Discharge status: Home / Self Care | Payer: Medicaid Other | Attending: Obstetrics and Gynecology | Admitting: Obstetrics and Gynecology

## 2020-03-24 ENCOUNTER — Encounter (HOSPITAL_COMMUNITY): Payer: Self-pay | Admitting: Obstetrics and Gynecology

## 2020-03-24 ENCOUNTER — Other Ambulatory Visit: Payer: Self-pay

## 2020-03-24 DIAGNOSIS — Z3689 Encounter for other specified antenatal screening: Secondary | ICD-10-CM | POA: Diagnosis not present

## 2020-03-24 DIAGNOSIS — O368130 Decreased fetal movements, third trimester, not applicable or unspecified: Secondary | ICD-10-CM | POA: Insufficient documentation

## 2020-03-24 DIAGNOSIS — Z3A37 37 weeks gestation of pregnancy: Secondary | ICD-10-CM | POA: Diagnosis not present

## 2020-03-24 NOTE — MAU Note (Signed)
Christine Bailey is a 25 y.o. at [redacted]w[redacted]d here in MAU reporting: has only felt 3 FM today. Was having contractions last night but none today. No bleeding, no LOF.  Pt has daughter with her, states someone is on the way to pick her up.   Onset of complaint:  Today  Pain score: 0/10  Vitals:   03/24/20 1523  BP: 131/82  Pulse: 100  Resp: 16  Temp: 98.6 F (37 C)  SpO2: 98%     FHT: 154  Lab orders placed from triage: none

## 2020-03-24 NOTE — MAU Provider Note (Signed)
History     CSN: [id number]  Arrival date and time: 03/24/20 1513   First Provider Initiated Contact with Patient 03/24/20 1555      Chief Complaint  Patient presents with  . Decreased Fetal Movement   HPI Christine Bailey is a 24 y.o. G2P1001 at 79w1dwho presents with decreased fetal movement. She states she has only felt the baby move 3 times today. The last time she felt the baby was in triage in MAU. She denies any pain, vaginal bleeding or leaking of fluid. She is requesting a cervical exam but is not having contractions. She states she is just wondering if she is dilated because she recently had a baby 10 months ago.   OB History    Gravida  2   Para  1   Term  1   Preterm      AB      Living  1     SAB      TAB      Ectopic      Multiple  0   Live Births  1           Past Medical History:  Diagnosis Date  . Chlamydia   . Eczema   . Medical history non-contributory     Past Surgical History:  Procedure Laterality Date  . NO PAST SURGERIES      Family History  Adopted: Yes  Problem Relation Age of Onset  . Cancer Mother        Breast    Social History   Tobacco Use  . Smoking status: Never Smoker  . Smokeless tobacco: Never Used  Vaping Use  . Vaping Use: Never used  Substance Use Topics  . Alcohol use: Not Currently    Comment: occasionaly - none since pregnancy  . Drug use: Not Currently    Types: Marijuana    Comment: last used 04/2018    Allergies: No Known Allergies  Medications Prior to Admission  Medication Sig Dispense Refill Last Dose  . Prenatal Vit-Fe Fumarate-FA (PRENATAL VITAMIN) 27-0.8 MG TABS Take 1 tablet by mouth daily. 30 tablet 12 03/24/2020 at Unknown time  . Accu-Chek Softclix Lancets lancets Use as instructed 100 each 12   . acetaminophen (TYLENOL) 325 MG tablet Take 2 tablets (650 mg total) by mouth every 6 (six) hours as needed (for pain scale < 4). (Patient not taking: Reported on 02/20/2020) 30 tablet 0    . Blood Pressure Monitoring (BLOOD PRESSURE KIT) DEVI 1 Device by Does not apply route as needed. ICD 10:Z34.00 1 Device 0   . glucose blood (ACCU-CHEK GUIDE) test strip To check blood sugars 4 times a day. Fasting, and 2 hours after Breakfast, Lunch and Dinner 100 each 12   . ibuprofen (ADVIL) 800 MG tablet Take 1 tablet (800 mg total) by mouth every 8 (eight) hours as needed for headache or cramping. (Patient not taking: Reported on 02/20/2020) 30 tablet 0     Review of Systems  Constitutional: Negative.  Negative for fatigue and fever.  HENT: Negative.   Respiratory: Negative.  Negative for shortness of breath.   Cardiovascular: Negative.  Negative for chest pain.  Gastrointestinal: Negative.  Negative for abdominal pain, constipation, diarrhea, nausea and vomiting.  Genitourinary: Negative.  Negative for dysuria.  Neurological: Negative.  Negative for dizziness and headaches.   Physical Exam   Blood pressure 110/63, pulse (!) 108, temperature 98.4 F (36.9 C), temperature source Oral, resp. rate 18, height  _0  (1.702 m), weight 100.2 kg, SpO2 99 %, unknown if currently breastfeeding.  Physical Exam Vitals and nursing note reviewed.  Constitutional:      General: She is not in acute distress.    Appearance: She is well-developed.  HENT:     Head: Normocephalic.  Eyes:     Pupils: Pupils are equal, round, and reactive to light.  Cardiovascular:     Rate and Rhythm: Normal rate and regular rhythm.     Heart sounds: Normal heart sounds.  Pulmonary:     Effort: Pulmonary effort is normal. No respiratory distress.     Breath sounds: Normal breath sounds.  Abdominal:     General: Bowel sounds are normal. There is no distension.     Palpations: Abdomen is soft.     Tenderness: There is no abdominal tenderness.  Skin:    General: Skin is warm and dry.  Neurological:     Mental Status: She is alert and oriented to person, place, and time.  Psychiatric:        Behavior:  Behavior normal.        Thought Content: Thought content normal.        Judgment: Judgment normal.    Fetal Tracing:  Baseline: 140 Variability: moderate Accels: 15x15 Decels: none  Toco: occasional uc's with UI  Dilation: 1 Effacement (%): Thick, 40 Cervical Position: Posterior Station: -2 Presentation: Vertex Exam by:: DFM   MAU Course  Procedures  MDM NST reactive Patient reports return of normal fetal movement, using clicker to indicate movement on monitor  Assessment and Plan   1. NST (non-stress test) reactive   2. Decreased fetal movements in third trimester, single or unspecified fetus    -Discharge home in stable condition -Fetal kick count and labor precautions discussed -Patient advised to follow-up with OB as scheduled for prenatal care -Patient may return to MAU as needed or if her condition were to change or worsen   Wende Mott CNM 03/24/2020, 3:55 PM

## 2020-03-24 NOTE — MAU Note (Signed)
G2P1 at 37.1 weeks with reports of feeling baby move only once today and once in waiting room. Pt states baby is usually very active. States she had contractions yesterday and baby's movement has been decreased since. Pt denies vaginal bleeding or bloody show, SROM or regular painful contractions.

## 2020-03-24 NOTE — Discharge Instructions (Signed)
Fetal Movement Counts Patient Name: ________________________________________________ Patient Due Date: ____________________ What is a fetal movement count?  A fetal movement count is the number of times that you feel your baby move during a certain amount of time. This may also be called a fetal kick count. A fetal movement count is recommended for every pregnant woman. You may be asked to start counting fetal movements as early as week 28 of your pregnancy. Pay attention to when your baby is most active. You may notice your baby's sleep and wake cycles. You may also notice things that make your baby move more. You should do a fetal movement count:  When your baby is normally most active.  At the same time each day. A good time to count movements is while you are resting, after having something to eat and drink. How do I count fetal movements? 1. Find a quiet, comfortable area. Sit, or lie down on your side. 2. Write down the date, the start time and stop time, and the number of movements that you felt between those two times. Take this information with you to your health care visits. 3. Write down your start time when you feel the first movement. 4. Count kicks, flutters, swishes, rolls, and jabs. You should feel at least 10 movements. 5. You may stop counting after you have felt 10 movements, or if you have been counting for 2 hours. Write down the stop time. 6. If you do not feel 10 movements in 2 hours, contact your health care provider for further instructions. Your health care provider may want to do additional tests to assess your baby's well-being. Contact a health care provider if:  You feel fewer than 10 movements in 2 hours.  Your baby is not moving like he or she usually does. Date: ____________ Start time: ____________ Stop time: ____________ Movements: ____________ Date: ____________ Start time: ____________ Stop time: ____________ Movements: ____________ Date: ____________  Start time: ____________ Stop time: ____________ Movements: ____________ Date: ____________ Start time: ____________ Stop time: ____________ Movements: ____________ Date: ____________ Start time: ____________ Stop time: ____________ Movements: ____________ Date: ____________ Start time: ____________ Stop time: ____________ Movements: ____________ Date: ____________ Start time: ____________ Stop time: ____________ Movements: ____________ Date: ____________ Start time: ____________ Stop time: ____________ Movements: ____________ Date: ____________ Start time: ____________ Stop time: ____________ Movements: ____________ This information is not intended to replace advice given to you by your health care provider. Make sure you discuss any questions you have with your health care provider. Document Revised: 11/24/2018 Document Reviewed: 11/24/2018 Elsevier Patient Education  2020 [employer]  

## 2020-03-28 ENCOUNTER — Other Ambulatory Visit: Payer: Self-pay

## 2020-03-28 ENCOUNTER — Ambulatory Visit (INDEPENDENT_AMBULATORY_CARE_PROVIDER_SITE_OTHER): Payer: Medicaid Other | Admitting: Obstetrics & Gynecology

## 2020-03-28 VITALS — BP 119/75 | HR 103 | Wt 223.6 lb

## 2020-03-28 DIAGNOSIS — O368130 Decreased fetal movements, third trimester, not applicable or unspecified: Secondary | ICD-10-CM

## 2020-03-28 DIAGNOSIS — O0990 Supervision of high risk pregnancy, unspecified, unspecified trimester: Secondary | ICD-10-CM

## 2020-03-28 NOTE — Progress Notes (Signed)
   PRENATAL VISIT NOTE  Subjective:  [name] is a 24 y.o. G2P1001 at [redacted]w[redacted]d being seen today for ongoing prenatal care.  She is currently monitored for the following issues for this high-risk pregnancy and has Eczema; Postpartum exam; Supervision of high risk pregnancy, antepartum; Short interval between pregnancies affecting pregnancy, antepartum; Genetic carrier; Gestational diabetes; [redacted] weeks gestation of pregnancy; and LGSIL on Pap smear of cervix on their problem list.  Patient reports decreased fetal movement.  Contractions: Not present. Vag. Bleeding: None.  Movement: Present. Denies leaking of fluid.   The following portions of the patient's history were reviewed and updated as appropriate: allergies, current medications, past family history, past medical history, past social history, past surgical history and problem list.   Objective:   Vitals:   03/28/20 1049  BP: 119/75  Pulse: (!) 103  Weight: 223 lb 9.6 oz (101.4 kg)    Fetal Status: Fetal Heart Rate (bpm): 150   Movement: Present     General:  Alert, oriented and cooperative. Patient is in no acute distress.  Skin: Skin is warm and dry. No rash noted.   Cardiovascular: Normal heart rate noted  Respiratory: Normal respiratory effort, no problems with respiration noted  Abdomen: Soft, gravid, appropriate for gestational age.  Pain/Pressure: Absent     Pelvic: Cervical exam performed in the presence of a chaperone 0/0/-4        Extremities: Normal range of motion.  Edema: None  Mental Status: Normal mood and affect. Normal behavior. Normal judgment and thought content.   Assessment and Plan:  Pregnancy: G2P1001 at [redacted]w[redacted]d 1. Decreased fetal movements in third trimester, single or unspecified fetus NST AFI today, reactive NST 12/5  2. Supervision of high risk pregnancy, antepartum   Term labor symptoms and general obstetric precautions including but not limited to vaginal bleeding, contractions, leaking of fluid and  fetal movement were reviewed in detail with the patient. Please refer to After Visit Summary for other counseling recommendations.   No follow-ups on file.  No future appointments.  Christine Chamber, MD Patient ID: Christine Bailey, female   DOB: 10/26/1995, 24 y.o.   MRN: [id number]

## 2020-04-04 ENCOUNTER — Telehealth: Payer: Self-pay

## 2020-04-04 ENCOUNTER — Ambulatory Visit (INDEPENDENT_AMBULATORY_CARE_PROVIDER_SITE_OTHER): Payer: Medicaid Other | Admitting: Obstetrics & Gynecology

## 2020-04-04 ENCOUNTER — Other Ambulatory Visit: Payer: Self-pay

## 2020-04-04 ENCOUNTER — Encounter: Payer: Self-pay | Admitting: Obstetrics & Gynecology

## 2020-04-04 VITALS — BP 134/81 | HR 100 | Wt 219.8 lb

## 2020-04-04 DIAGNOSIS — O0990 Supervision of high risk pregnancy, unspecified, unspecified trimester: Secondary | ICD-10-CM

## 2020-04-04 DIAGNOSIS — O24410 Gestational diabetes mellitus in pregnancy, diet controlled: Secondary | ICD-10-CM

## 2020-04-04 NOTE — Patient Instructions (Signed)

## 2020-04-04 NOTE — Telephone Encounter (Signed)
Called Pt to advise of Induction scheduled for 04/09/20 & that L&D will be contacting her with the exact time.No answer, left VM.

## 2020-04-04 NOTE — Progress Notes (Signed)
IOL scheduled for 04/09/20 in the AM

## 2020-04-04 NOTE — Progress Notes (Signed)
   PRENATAL VISIT NOTE  Subjective:  Christine Bailey is a 24 y.o. G2P1001 at [redacted]w[redacted]d being seen today for ongoing prenatal care.  She is currently monitored for the following issues for this high-risk pregnancy and has Eczema; Supervision of high risk pregnancy, antepartum; Short interval between pregnancies affecting pregnancy, antepartum; Genetic carrier; Gestational diabetes; [redacted] weeks gestation of pregnancy; and LGSIL on Pap smear of cervix on their problem list.  Patient reports no complaints.  Contractions: Not present. Vag. Bleeding: None.  Movement: Present. Denies leaking of fluid.   The following portions of the patient's history were reviewed and updated as appropriate: allergies, current medications, past family history, past medical history, past social history, past surgical history and problem list.   Objective:   Vitals:   04/04/20 1005  BP: 134/81  Pulse: 100  Weight: 219 lb 12.8 oz (99.7 kg)    Fetal Status: Fetal Heart Rate (bpm): 160 Fundal Height: 38 cm Movement: Present     General:  Alert, oriented and cooperative. Patient is in no acute distress.  Skin: Skin is warm and dry. No rash noted.   Cardiovascular: Normal heart rate noted  Respiratory: Normal respiratory effort, no problems with respiration noted  Abdomen: Soft, gravid, appropriate for gestational age.  Pain/Pressure: Present     Pelvic: Cervical exam deferred        Extremities: Normal range of motion.  Edema: Trace  Mental Status: Normal mood and affect. Normal behavior. Normal judgment and thought content.   Assessment and Plan:  Pregnancy: G2P1001 at [redacted]w[redacted]d 1. Supervision of high risk pregnancy, antepartum BP 134/81 today, normal growth on [place]  2. Diet controlled gestational diabetes mellitus (GDM) in third trimester Well controlled. Needs IOL by 12/25, will scheduled 12/21 with FB on 12/20   Term labor symptoms and general obstetric precautions including but not limited to vaginal bleeding,  contractions, leaking of fluid and fetal movement were reviewed in detail with the patient. Please refer to After Visit Summary for other counseling recommendations.   Return in about 4 days (around 04/08/2020) for afternoon eval for Foley balloon.  No future appointments.  Scheryl Darter, MD

## 2020-04-05 ENCOUNTER — Telehealth (HOSPITAL_COMMUNITY): Payer: Self-pay | Admitting: *Deleted

## 2020-04-05 ENCOUNTER — Encounter (HOSPITAL_COMMUNITY): Payer: Self-pay | Admitting: *Deleted

## 2020-04-05 NOTE — Telephone Encounter (Signed)
Preadmission screen  

## 2020-04-07 ENCOUNTER — Other Ambulatory Visit: Payer: Self-pay | Admitting: Advanced Practice Midwife

## 2020-04-08 ENCOUNTER — Other Ambulatory Visit: Payer: Self-pay | Admitting: Advanced Practice Midwife

## 2020-04-08 ENCOUNTER — Other Ambulatory Visit: Payer: Self-pay

## 2020-04-08 ENCOUNTER — Encounter: Payer: Self-pay | Admitting: Obstetrics & Gynecology

## 2020-04-08 ENCOUNTER — Other Ambulatory Visit (HOSPITAL_COMMUNITY)
Admission: RE | Admit: 2020-04-08 | Discharge: 2020-04-08 | Disposition: A | Discharge status: Home / Self Care | Payer: Medicaid Other | Source: Ambulatory Visit | Attending: Obstetrics and Gynecology | Admitting: Obstetrics and Gynecology

## 2020-04-08 ENCOUNTER — Ambulatory Visit (INDEPENDENT_AMBULATORY_CARE_PROVIDER_SITE_OTHER): Payer: Medicaid Other | Admitting: Obstetrics & Gynecology

## 2020-04-08 VITALS — BP 128/72 | HR 93 | Wt 229.4 lb

## 2020-04-08 DIAGNOSIS — O09899 Supervision of other high risk pregnancies, unspecified trimester: Secondary | ICD-10-CM

## 2020-04-08 DIAGNOSIS — O24410 Gestational diabetes mellitus in pregnancy, diet controlled: Secondary | ICD-10-CM

## 2020-04-08 DIAGNOSIS — O0990 Supervision of high risk pregnancy, unspecified, unspecified trimester: Secondary | ICD-10-CM

## 2020-04-08 DIAGNOSIS — Z20822 Contact with and (suspected) exposure to covid-19: Secondary | ICD-10-CM | POA: Insufficient documentation

## 2020-04-08 LAB — SARS CORONAVIRUS 2 (TAT 6-24 HRS): SARS Coronavirus 2: NEGATIVE

## 2020-04-08 NOTE — Progress Notes (Signed)
   PRENATAL VISIT NOTE  Subjective:  Christine Bailey is a 24 y.o. G2P1001 at [redacted]w[redacted]d being seen today for ongoing prenatal care.  She is currently monitored for the following issues for this high-risk pregnancy and has Eczema; Supervision of high risk pregnancy, antepartum; Short interval between pregnancies affecting pregnancy, antepartum; Genetic carrier; Gestational diabetes; [redacted] weeks gestation of pregnancy; and LGSIL on Pap smear of cervix on their problem list.  Patient reports no complaints.  Contractions: Irregular. Vag. Bleeding: None.  Movement: Present. Denies leaking of fluid.   The following portions of the patient's history were reviewed and updated as appropriate: allergies, current medications, past family history, past medical history, past social history, past surgical history and problem list.   Objective:   Vitals:   04/08/20 1517  BP: 128/72  Pulse: 93  Weight: 229 lb 6.4 oz (104.1 kg)    Fetal Status: Fetal Heart Rate (bpm): NST   Movement: Present  Presentation: Vertex  General:  Alert, oriented and cooperative. Patient is in no acute distress.  Skin: Skin is warm and dry. No rash noted.   Cardiovascular: Normal heart rate noted  Respiratory: Normal respiratory effort, no problems with respiration noted  Abdomen: Soft, gravid, appropriate for gestational age.  Pain/Pressure: Present     Pelvic: Cervical exam performed in the presence of a chaperone Dilation: 3 Effacement (%): 30 Station: -2  Extremities: Normal range of motion.  Edema: Trace  Mental Status: Normal mood and affect. Normal behavior. Normal judgment and thought content.   Assessment and Plan:  Pregnancy: G2P1001 at [redacted]w[redacted]d 1. Supervision of high risk pregnancy, antepartum - normal BP today - cervix 3/30/-2.  Will plan induction tomorrow with pitocin.  Foley bulb was not placed.   2. Short interval between pregnancies affecting pregnancy, antepartum   3. Diet controlled gestational diabetes mellitus  (GDM) in third trimester   Term labor symptoms and general obstetric precautions including but not limited to vaginal bleeding, contractions, leaking of fluid and fetal movement were reviewed in detail with the patient. Please refer to After Visit Summary for other counseling recommendations.   No follow-ups on file.  Future Appointments  Date Time Provider Department Center  04/09/2020  7:00 AM [facility] SCHED ROOM [facility] None    Jerene Bears, MD

## 2020-04-08 NOTE — Progress Notes (Signed)
IOL scheduled tomorrow 

## 2020-04-09 ENCOUNTER — Inpatient Hospital Stay (HOSPITAL_COMMUNITY)
Admission: AD | Admit: 2020-04-09 | Discharge: 2020-04-11 | DRG: 807 | Disposition: A | Discharge status: Home / Self Care | Payer: Medicaid Other | Attending: Family Medicine | Admitting: Family Medicine

## 2020-04-09 ENCOUNTER — Other Ambulatory Visit: Payer: Self-pay

## 2020-04-09 ENCOUNTER — Encounter (HOSPITAL_COMMUNITY): Payer: Self-pay | Admitting: Family Medicine

## 2020-04-09 ENCOUNTER — Inpatient Hospital Stay (HOSPITAL_COMMUNITY): Payer: Medicaid Other

## 2020-04-09 DIAGNOSIS — O24420 Gestational diabetes mellitus in childbirth, diet controlled: Secondary | ICD-10-CM | POA: Diagnosis not present

## 2020-04-09 DIAGNOSIS — Z8632 Personal history of gestational diabetes: Secondary | ICD-10-CM | POA: Diagnosis present

## 2020-04-09 DIAGNOSIS — O24410 Gestational diabetes mellitus in pregnancy, diet controlled: Secondary | ICD-10-CM

## 2020-04-09 DIAGNOSIS — Z20822 Contact with and (suspected) exposure to covid-19: Secondary | ICD-10-CM | POA: Diagnosis present

## 2020-04-09 DIAGNOSIS — O134 Gestational [pregnancy-induced] hypertension without significant proteinuria, complicating childbirth: Secondary | ICD-10-CM | POA: Diagnosis present

## 2020-04-09 DIAGNOSIS — Z3A39 39 weeks gestation of pregnancy: Secondary | ICD-10-CM

## 2020-04-09 DIAGNOSIS — Z8759 Personal history of other complications of pregnancy, childbirth and the puerperium: Secondary | ICD-10-CM | POA: Diagnosis present

## 2020-04-09 DIAGNOSIS — O133 Gestational [pregnancy-induced] hypertension without significant proteinuria, third trimester: Secondary | ICD-10-CM

## 2020-04-09 LAB — PROTEIN / CREATININE RATIO, URINE
Creatinine, Urine: 53.14 mg/dL
Protein Creatinine Ratio: 0.17 mg/mg{Cre} — ABNORMAL HIGH (ref 0.00–0.15)
Total Protein, Urine: 9 mg/dL

## 2020-04-09 LAB — COMPREHENSIVE METABOLIC PANEL
ALT: 11 U/L (ref 0–44)
AST: 18 U/L (ref 15–41)
Albumin: 2.9 g/dL — ABNORMAL LOW (ref 3.5–5.0)
Alkaline Phosphatase: 202 U/L — ABNORMAL HIGH (ref 38–126)
Anion gap: 11 (ref 5–15)
BUN: 8 mg/dL (ref 6–20)
CO2: 18 mmol/L — ABNORMAL LOW (ref 22–32)
Calcium: 8.9 mg/dL (ref 8.9–10.3)
Chloride: 106 mmol/L (ref 98–111)
Creatinine, Ser: 0.45 mg/dL (ref 0.44–1.00)
GFR, Estimated: >60 mL/min (ref >60)
Glucose, Bld: 75 mg/dL (ref 70–99)
Potassium: 3.8 mmol/L (ref 3.5–5.1)
Sodium: 135 mmol/L (ref 135–145)
Total Bilirubin: 0.4 mg/dL (ref 0.3–1.2)
Total Protein: 6.1 g/dL — ABNORMAL LOW (ref 6.5–8.1)

## 2020-04-09 LAB — CBC
HCT: 31.2 % — ABNORMAL LOW (ref 36.0–46.0)
Hemoglobin: 10.2 g/dL — ABNORMAL LOW (ref 12.0–15.0)
MCH: 29.4 pg (ref 26.0–34.0)
MCHC: 32.7 g/dL (ref 30.0–36.0)
MCV: 89.9 fL (ref 80.0–100.0)
Platelets: 196 10*3/uL (ref 150–400)
RBC: 3.47 MIL/uL — ABNORMAL LOW (ref 3.87–5.11)
RDW: 14.9 % (ref 11.5–15.5)
WBC: 9.2 10*3/uL (ref 4.0–10.5)
nRBC: 0 % (ref 0.0–0.2)

## 2020-04-09 LAB — TYPE AND SCREEN
ABO/RH(D): A POS
Antibody Screen: NEGATIVE

## 2020-04-09 MED ORDER — LACTATED RINGERS IV SOLN: 500.0000 mL | INTRAVENOUS | Status: DC | PRN

## 2020-04-09 MED ORDER — ACETAMINOPHEN 325 MG PO TABS: 650.0000 mg | ORAL_TABLET | ORAL | Status: DC | PRN

## 2020-04-09 MED ORDER — MISOPROSTOL 25 MCG QUARTER TABLET
ORAL_TABLET | ORAL | Status: AC
  Filled 2020-04-09: qty 1

## 2020-04-09 MED ORDER — LACTATED RINGERS IV SOLN: INTRAVENOUS | Status: DC

## 2020-04-09 MED ORDER — SODIUM CHLORIDE 0.9 % IV SOLN: 5.0000 10*6.[IU] | Freq: Once | INTRAVENOUS | Status: DC

## 2020-04-09 MED ORDER — FLEET ENEMA 7-19 GM/118ML RE ENEM: 1.0000 | ENEMA | RECTAL | Status: DC | PRN

## 2020-04-09 MED ORDER — LIDOCAINE HCL (PF) 1 % IJ SOLN: 30.0000 mL | INTRAMUSCULAR | Status: DC | PRN

## 2020-04-09 MED ORDER — SOD CITRATE-CITRIC ACID 500-334 MG/5ML PO SOLN: 30.0000 mL | ORAL | Status: DC | PRN

## 2020-04-09 MED ORDER — OXYCODONE-ACETAMINOPHEN 5-325 MG PO TABS: 1.0000 | ORAL_TABLET | ORAL | Status: DC | PRN

## 2020-04-09 MED ORDER — TERBUTALINE SULFATE 1 MG/ML IJ SOLN: 0.2500 mg | Freq: Once | INTRAMUSCULAR | Status: DC | PRN

## 2020-04-09 MED ORDER — MISOPROSTOL 50MCG HALF TABLET
ORAL_TABLET | ORAL | Status: AC
  Filled 2020-04-09: qty 1

## 2020-04-09 MED ORDER — MISOPROSTOL 50MCG HALF TABLET
50.0000 ug | ORAL_TABLET | ORAL | Status: DC | PRN
  Administered 2020-04-09 (×2): 50 ug via BUCCAL
  Filled 2020-04-09: qty 1

## 2020-04-09 MED ORDER — OXYTOCIN-SODIUM CHLORIDE 30-0.9 UT/500ML-% IV SOLN
2.5000 [IU]/h | INTRAVENOUS | Status: DC
  Administered 2020-04-10 (×2): 2.5 [IU]/h via INTRAVENOUS

## 2020-04-09 MED ORDER — OXYTOCIN BOLUS FROM INFUSION
333.0000 mL | Freq: Once | INTRAVENOUS | Status: AC
  Administered 2020-04-10: 03:00:00 333 mL via INTRAVENOUS

## 2020-04-09 MED ORDER — OXYCODONE-ACETAMINOPHEN 5-325 MG PO TABS: 2.0000 | ORAL_TABLET | ORAL | Status: DC | PRN

## 2020-04-09 MED ORDER — FENTANYL CITRATE (PF) 100 MCG/2ML IJ SOLN
100.0000 ug | INTRAMUSCULAR | Status: DC | PRN
  Administered 2020-04-10: 100 ug via INTRAVENOUS
  Filled 2020-04-09: qty 2

## 2020-04-09 MED ORDER — OXYTOCIN-SODIUM CHLORIDE 30-0.9 UT/500ML-% IV SOLN
1.0000 m[IU]/min | INTRAVENOUS | Status: DC
  Administered 2020-04-09: 23:00:00 2 m[IU]/min via INTRAVENOUS
  Filled 2020-04-09: qty 500

## 2020-04-09 MED ORDER — ONDANSETRON HCL 4 MG/2ML IJ SOLN: 4.0000 mg | Freq: Four times a day (QID) | INTRAMUSCULAR | Status: DC | PRN

## 2020-04-09 MED ORDER — MISOPROSTOL 25 MCG QUARTER TABLET
25.0000 ug | ORAL_TABLET | ORAL | Status: DC | PRN
  Administered 2020-04-09: 19:00:00 25 ug via VAGINAL

## 2020-04-09 MED ORDER — PENICILLIN G POT IN DEXTROSE 60000 UNIT/ML IV SOLN: 3.0000 10*6.[IU] | INTRAVENOUS | Status: DC

## 2020-04-09 NOTE — Progress Notes (Signed)
CBG 65 @ 1440 - patient asymptomatic  Given 4oz of juice  CBG 107 @ 1500  [place] MD notified

## 2020-04-09 NOTE — Progress Notes (Signed)
CBG 71.  

## 2020-04-09 NOTE — Progress Notes (Signed)
CBG 76 

## 2020-04-09 NOTE — Progress Notes (Signed)
Labor Progress Note Christine Bailey is a 24 y.o. G2P1001 at [redacted]w[redacted]d presented for IOL-A1GDM. S: feeling comfortable overall. Having a bit of cramps. Would like to eat.  O:  BP 139/81   Pulse 90   Temp 97.9 F (36.6 C) (Oral)   Resp 16   Ht 5\' 7"  (1.702 m)   Wt 104.5 kg   LMP  (LMP Unknown)   BMI 36.09 kg/m  EFM: baseline 145bpm/mod variability/+accels/no decels Toco: intermittent  CVE: Dilation: 4 Effacement (%): 50 Cervical Position: Posterior Station: -3 Presentation: Vertex Exam by:: Sims RN   A&P: 24 y.o. G2P1001 [redacted]w[redacted]d presented for IOL-A1GDM. #IOL: S/p cytox3, will proceed with pitocin at four hour mark from last dose.  #Pain: PRN #FWB: cat 1 #GBS negative #A1GDM: q4 hour BGL during latent labor, most recent 65>107>76 #gestational HTN: New diagnosis based on multiple MR pressures since admission. PEC labs nml. No other signs or symptoms of PEC.  Continue to monitor.  [redacted]w[redacted]d, MD 10:42 PM

## 2020-04-09 NOTE — Progress Notes (Signed)
Labor Progress Note Shylin Eilert is a 24 y.o. G2P1001 at [redacted]w[redacted]d presented for IOL-A1GDM. S: Strip reviewed.  O:  BP 134/74   Pulse 95   Temp 97.9 F (36.6 C) (Oral)   Resp 16   Ht 5\' 7"  (1.702 m)   Wt 104.5 kg   LMP  (LMP Unknown)   BMI 36.09 kg/m  EFM: baseline 150bpm/mod variability/+accels/no decels Toco: intermittent, difficult to trace  CVE: Dilation: 3 Effacement (%): 50 Cervical Position: Posterior Station: -3 Presentation: Vertex Exam by:: [email or web address], MD   A&P: 24 y.o. G2P1001 [redacted]w[redacted]d presented for IOL-A1GDM. #IOL: S/p cyto x2. Given cervical exam will redose cytotec at this time and consider initiating pitocin at next check. #Pain: PRN #FWB: cat 1 #GBS negative #A1GDM: q4 hour BGL during latent labor, q2 during active labor.  #Elevated BP: BP on admit 137/97, has had additionally 142/86 and 142/89, 3 hours apart. Does not meet diagnostic criteria for gHTN. Asymptomatic. PreE labs nml. Continue to monitor.  [redacted]w[redacted]d, MD 6:45 PM

## 2020-04-09 NOTE — Progress Notes (Signed)
Labor Progress Note Christine Bailey is a 24 y.o. G2P1001 at [redacted]w[redacted]d presented for IOL-A1GDM. S: Doing well without complaints.  O:  BP 134/78   Pulse 96   Temp 98.5 F (36.9 C) (Oral)   Resp 17   Ht 5\' 7"  (1.702 m)   Wt 104.5 kg   LMP  (LMP Unknown)   BMI 36.09 kg/m  EFM: baseline 150bpm/mod variability/+accels/no decels Toco: intermittent  CVE: Dilation: 3 Effacement (%): 50 Cervical Position: Posterior Station: -3 Presentation: Vertex Exam by:: [email or web address], MD   A&P: 24 y.o. G2P1001 [redacted]w[redacted]d presented for IOL-A1GDM. #IOL: S/p cytox1. Given cervical exam, posterior and fairly thick will re-dose cytotec at this time. #Pain: PRN #FWB: cat 1 #GBS negative #A1GDM: q4 hour BGL during latent labor, q2 during active labor.  #Elevated BP: BP on admit 137/97. Asymptomatic. No diagnosis. PreE labs nml. Continue to monitor.  [redacted]w[redacted]d, MD 2:36 PM

## 2020-04-09 NOTE — H&P (Signed)
OBSTETRIC ADMISSION HISTORY AND PHYSICAL  Emanuelle Sasaki is a 24 y.o. female G2P1001 with IUP at [redacted]w[redacted]d by early u/s presenting for IOL-A1GDM. She reports +FMs, No LOF, no VB, no blurry vision, headaches or peripheral edema, and RUQ pain.  She plans on bottle feeding. She request post placental liletta for birth control if she gets an epidural, will place at post partum appointment if not.  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 Chlamydia   . Eczema   . Gestational diabetes     Past Surgical History: Past Surgical History:  Procedure Laterality Date  . NO PAST SURGERIES      Obstetrical History: OB History    Gravida  2   Para  1   Term  1   Preterm      AB      Living  1     SAB      IAB      Ectopic      Multiple  0   Live Births  1           Social History Social History   Socioeconomic History  . Marital status: Single    Spouse name: Not on file  . Number of children: Not on file  . Years of education: Not on file  . Highest education level: Not on file  Occupational History    Comment: Works @ [employer]  Tobacco Use  . Smoking status: Never Smoker  . Smokeless tobacco: Never Used  Vaping Use  . Vaping Use: Never used  Substance and Sexual Activity  . Alcohol use: Not Currently    Comment: occasionaly - none since pregnancy  . Drug use: Not Currently    Types: Marijuana    Comment: last used 04/2018  . Sexual activity: Yes    Birth control/protection: None  Other Topics Concern  . Not on file  Social History Narrative  . Not on file   Social Determinants of Health   Financial  Resource Strain: Not on file  Food Insecurity: No Food Insecurity  . Worried About Charity fundraiser in the Last Year: Never true  . Ran Out of Food in the Last Year: Never true  Transportation Needs: No Transportation Needs  . Lack of Transportation (Medical): No  . Lack of Transportation (Non-Medical): No  Physical Activity: Not on file  Stress: Not on file  Social Connections: Not on file    Family History: Family History  Adopted: Yes  Problem Relation Age of Onset  . Cancer Mother        Breast    Allergies: No Known Allergies  Medications Prior to Admission  Medication Sig Dispense Refill Last Dose  . Prenatal Vit-Fe Fumarate-FA (PRENATAL VITAMIN) 27-0.8 MG TABS Take 1 tablet by mouth daily. 30 tablet 12 04/09/2020 at Unknown time  . Accu-Chek Softclix Lancets lancets Use as instructed 100 each 12   . acetaminophen (TYLENOL) 325 MG tablet Take 2 tablets (650 mg total) by mouth every 6 (six) hours as needed (for pain scale < 4). (Patient not taking: No sig reported) 30 tablet 0   . Blood Pressure Monitoring (  BLOOD PRESSURE KIT) DEVI 1 Device by Does not apply route as needed. ICD 10:Z34.00 1 Device 0   . glucose blood (ACCU-CHEK GUIDE) test strip To check blood sugars 4 times a day. Fasting, and 2 hours after Breakfast, Lunch and Dinner 100 each 12      Review of Systems   All systems reviewed and negative except as stated in HPI  Height $Remov'5\' 7"'UepSTL$  (1.702 m), weight 104.5 kg, unknown if currently breastfeeding. General appearance: alert, cooperative and no distress Lungs: normal respiratory effort Heart: regular rate and rhythm Abdomen: soft, non-tender; gravid Pelvic: as noted below Extremities: Homans sign is negative, no sign of DVT Presentation: cephalic by RN exam Fetal monitoringBaseline: 150 bpm, Variability: Good {> 6 bpm), Accelerations: Reactive and Decelerations: Absent Uterine activity irritable     Prenatal labs: ABO, Rh: A/Positive/-- (06/29  1032) Antibody: Negative (06/29 1032) Rubella: 1.06 (06/29 1032) RPR: Non Reactive (10/05 1116)  HBsAg: Negative (06/29 1032)  HIV: Non Reactive (06/29 1032)  GBS: Negative/-- (11/30 1156)  2 hr Glucola failed, A1GDM Genetic screening SMA carrier, otherwise normal Anatomy US limited, f/u normal  Prenatal Transfer Tool  Maternal Diabetes: Yes:  Diabetes Type:  Diet controlled Genetic Screening: Abnormal:  Results: Other:SMA carrier, otherwise normal Maternal Ultrasounds/Referrals: Normal Fetal Ultrasounds or other Referrals:  None Maternal Substance Abuse:  No Significant Maternal Medications:  None Significant Maternal Lab Results: Group B Strep negative  No results found for this or any previous visit (from the past 24 hour(s)).  Patient Active Problem List   Diagnosis Date Noted  . GDM (gestational diabetes mellitus), class A1 04/09/2020  . 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 Gestational diabetes 01/26/2020  . Genetic carrier 10/17/2019  . Supervision of high risk pregnancy, antepartum 10/10/2019  . Short interval between pregnancies affecting pregnancy, antepartum 10/10/2019  . Eczema 11/01/2018    Assessment/Plan:  Blakelynn Scheeler is a 24 y.o. G2P1001 at [redacted]w[redacted]d here for IOL-A1GDM.  #IOL: Discussed induction process with patient. Given cervical exam and thickness, will dose cytotec and likely start pitocin in 4 hours.   #A1GDM: q4 BGL during latent labor and q2 during active labor. #Pain: PRN #FWB:  cat 1 #ID: GBS neg #MOF: bottle #MOC: post placental liletta if epidural, if not will place at postpartum visit. Consented and forms signed. #Circ: n/a  Arrie Senate, MD  04/09/2020, 10:35 AM

## 2020-04-10 ENCOUNTER — Encounter (HOSPITAL_COMMUNITY): Payer: Self-pay | Admitting: Family Medicine

## 2020-04-10 ENCOUNTER — Inpatient Hospital Stay (HOSPITAL_COMMUNITY): Payer: Medicaid Other | Admitting: Anesthesiology

## 2020-04-10 DIAGNOSIS — O24420 Gestational diabetes mellitus in childbirth, diet controlled: Secondary | ICD-10-CM | POA: Diagnosis not present

## 2020-04-10 DIAGNOSIS — O24429 Gestational diabetes mellitus in childbirth, unspecified control: Secondary | ICD-10-CM | POA: Diagnosis not present

## 2020-04-10 DIAGNOSIS — Z3A39 39 weeks gestation of pregnancy: Secondary | ICD-10-CM | POA: Diagnosis not present

## 2020-04-10 DIAGNOSIS — Z8759 Personal history of other complications of pregnancy, childbirth and the puerperium: Secondary | ICD-10-CM | POA: Diagnosis present

## 2020-04-10 DIAGNOSIS — O134 Gestational [pregnancy-induced] hypertension without significant proteinuria, complicating childbirth: Secondary | ICD-10-CM | POA: Diagnosis not present

## 2020-04-10 LAB — RPR: RPR Ser Ql: NONREACTIVE

## 2020-04-10 LAB — GLUCOSE, CAPILLARY
Glucose-Capillary: 71 mg/dL (ref 70–99)
Glucose-Capillary: 107 mg/dL — ABNORMAL HIGH (ref 70–99)
Glucose-Capillary: 65 mg/dL — ABNORMAL LOW (ref 70–99)
Glucose-Capillary: 76 mg/dL (ref 70–99)
Glucose-Capillary: 105 mg/dL — ABNORMAL HIGH (ref 70–99)

## 2020-04-10 MED ORDER — LACTATED RINGERS IV SOLN: 500.0000 mL | Freq: Once | INTRAVENOUS | Status: DC

## 2020-04-10 MED ORDER — BENZOCAINE-MENTHOL 20-0.5 % EX AERO: 1.0000 "application " | INHALATION_SPRAY | CUTANEOUS | Status: DC | PRN

## 2020-04-10 MED ORDER — ONDANSETRON HCL 4 MG PO TABS: 4.0000 mg | ORAL_TABLET | ORAL | Status: DC | PRN

## 2020-04-10 MED ORDER — EPHEDRINE 5 MG/ML INJ: 10.0000 mg | INTRAVENOUS | Status: DC | PRN

## 2020-04-10 MED ORDER — DIBUCAINE (PERIANAL) 1 % EX OINT: 1.0000 "application " | TOPICAL_OINTMENT | CUTANEOUS | Status: DC | PRN

## 2020-04-10 MED ORDER — SIMETHICONE 80 MG PO CHEW: 80.0000 mg | CHEWABLE_TABLET | ORAL | Status: DC | PRN

## 2020-04-10 MED ORDER — DOCUSATE SODIUM 100 MG PO CAPS
100.0000 mg | ORAL_CAPSULE | Freq: Two times a day (BID) | ORAL | Status: DC
  Filled 2020-04-10 (×2): qty 1

## 2020-04-10 MED ORDER — ONDANSETRON HCL 4 MG/2ML IJ SOLN
4.0000 mg | INTRAMUSCULAR | Status: DC | PRN
Start: 2020-04-10 — End: 2020-04-11

## 2020-04-10 MED ORDER — DIPHENHYDRAMINE HCL 50 MG/ML IJ SOLN: 12.5000 mg | INTRAMUSCULAR | Status: DC | PRN

## 2020-04-10 MED ORDER — SENNOSIDES-DOCUSATE SODIUM 8.6-50 MG PO TABS
2.0000 | ORAL_TABLET | ORAL | Status: DC
  Administered 2020-04-10: 06:00:00 2 via ORAL
  Filled 2020-04-10: qty 2

## 2020-04-10 MED ORDER — ACETAMINOPHEN 325 MG PO TABS: 650.0000 mg | ORAL_TABLET | ORAL | Status: DC | PRN

## 2020-04-10 MED ORDER — FENTANYL-BUPIVACAINE-NACL 0.5-0.125-0.9 MG/250ML-% EP SOLN
12.0000 mL/h | EPIDURAL | Status: DC | PRN
  Filled 2020-04-10: qty 250

## 2020-04-10 MED ORDER — PHENYLEPHRINE 40 MCG/ML (10ML) SYRINGE FOR IV PUSH (FOR BLOOD PRESSURE SUPPORT): 80.0000 ug | PREFILLED_SYRINGE | INTRAVENOUS | Status: DC | PRN

## 2020-04-10 MED ORDER — COCONUT OIL OIL: 1.0000 "application " | TOPICAL_OIL | Status: DC | PRN

## 2020-04-10 MED ORDER — PHENYLEPHRINE 40 MCG/ML (10ML) SYRINGE FOR IV PUSH (FOR BLOOD PRESSURE SUPPORT)
80.0000 ug | PREFILLED_SYRINGE | INTRAVENOUS | Status: DC | PRN
  Filled 2020-04-10: qty 10

## 2020-04-10 MED ORDER — IBUPROFEN 600 MG PO TABS
600.0000 mg | ORAL_TABLET | Freq: Four times a day (QID) | ORAL | Status: DC
  Administered 2020-04-10 – 2020-04-11 (×5): 600 mg via ORAL
  Filled 2020-04-10 (×6): qty 1

## 2020-04-10 MED ORDER — PRENATAL MULTIVITAMIN CH
1.0000 | ORAL_TABLET | Freq: Every day | ORAL | Status: DC
  Administered 2020-04-10 – 2020-04-11 (×2): 1 via ORAL
  Filled 2020-04-10 (×2): qty 1

## 2020-04-10 MED ORDER — WITCH HAZEL-GLYCERIN EX PADS: 1.0000 "application " | MEDICATED_PAD | CUTANEOUS | Status: DC | PRN

## 2020-04-10 MED ORDER — DIPHENHYDRAMINE HCL 25 MG PO CAPS: 25.0000 mg | ORAL_CAPSULE | Freq: Four times a day (QID) | ORAL | Status: DC | PRN

## 2020-04-10 MED ORDER — TETANUS-DIPHTH-ACELL PERTUSSIS 5-2.5-18.5 LF-MCG/0.5 IM SUSY: 0.5000 mL | PREFILLED_SYRINGE | Freq: Once | INTRAMUSCULAR | Status: DC

## 2020-04-10 NOTE — Anesthesia Preprocedure Evaluation (Deleted)
Anesthesia Evaluation  Patient identified by MRN, date of birth, ID band Patient awake    Reviewed: Allergy & Precautions, Patient's Chart, lab work & pertinent test results  History of Anesthesia Complications Negative for: history of anesthetic complications  Airway Mallampati: II  TM Distance: >3 FB Neck ROM: Full    Dental no notable dental hx.    Pulmonary neg pulmonary ROS,    Pulmonary exam normal        Cardiovascular negative cardio ROS Normal cardiovascular exam     Neuro/Psych negative neurological ROS  negative psych ROS   GI/Hepatic negative GI ROS, Neg liver ROS,   Endo/Other  diabetes, Gestational  Renal/GU negative Renal ROS  negative genitourinary   Musculoskeletal negative musculoskeletal ROS (+)   Abdominal   Peds  Hematology negative hematology ROS (+)   Anesthesia Other Findings Day of surgery medications reviewed with patient.  Reproductive/Obstetrics (+) Pregnancy (gHTN)                             Anesthesia Physical Anesthesia Plan  ASA: II  Anesthesia Plan: Epidural   Post-op Pain Management:    Induction:   PONV Risk Score and Plan: Treatment may vary due to age or medical condition  Airway Management Planned: Natural Airway  Additional Equipment:   Intra-op Plan:   Post-operative Plan:   Informed Consent: I have reviewed the patients History and Physical, chart, labs and discussed the procedure including the risks, benefits and alternatives for the proposed anesthesia with the patient or authorized representative who has indicated his/her understanding and acceptance.       Plan Discussed with:   Anesthesia Plan Comments:         Anesthesia Quick Evaluation

## 2020-04-10 NOTE — Discharge Summary (Signed)
Postpartum Discharge Summary     Patient Name: Christine Bailey DOB: 09-Mar-1996 MRN: [id number]  Date of admission: 04/09/2020 Delivery date:04/10/2020  Delivering provider: Janet Berlin  Date of discharge: 04/11/2020  Admitting diagnosis: GDM (gestational diabetes mellitus), class A1 [O24.410] Intrauterine pregnancy: [redacted]w[redacted]d    Secondary diagnosis:  Active Problems:   GDM (gestational diabetes mellitus), class A1   Vaginal delivery   Gestational hypertension  Additional problems: borderline elevated BPs postpartum    Discharge diagnosis: Term Pregnancy Delivered, A1GDM                                      Post partum procedures:none Augmentation: AROM, Pitocin and Cytotec Complications: None  Hospital course: Induction of Labor With Vaginal Delivery   24y.o. yo G2P1001 at [id number] admitted to the hospital 04/09/2020 for induction of labor.  Indication for induction: A1 DM.  She was diagnosed with gestational HTN due to multiple mild range pressures during her admission. Patient had an uncomplicated labor course as follows: Membrane Rupture Time/Date: 11:48 PM ,04/09/2020   Delivery Method:Vaginal, Spontaneous  Episiotomy: None  Lacerations:  None  Details of delivery can be found in separate delivery note.  Patient had a routine postpartum course. Except borderline elevated BPs postpartum.  Will start on Norvasc and recheck BP in a week (message sent)  Patient is discharged home 04/11/20.  Newborn Data: Birth date:04/10/2020  Birth time:2:50 AM  Gender:Female  Living status:Living  Apgars:8 ,9  Weight:3824 g   Magnesium Sulfate received: No BMZ received: No Rhophylac:N/A MMR:N/A T-DaP:Given prenatally Flu: No Transfusion:No  Physical exam  Vitals:   04/10/20 0600 04/10/20 1326 04/10/20 2144 04/11/20 0513  BP: 130/84 (!) 139/95 (!) 148/95 119/80  Pulse: 84 85 89 86  Resp: _0 Temp: 98.8 F (37.1 C)  99 F (37.2 C) 98.5 F (36.9 C)  TempSrc:  Oral  Oral Oral  SpO2:   99% 100%  Weight:      Height:       General: alert, cooperative and no distress Lochia: appropriate Uterine Fundus: firm Incision: n/a DVT Evaluation: No evidence of DVT seen on physical exam. Labs: Lab Results  Component Value Date   WBC 9.2 04/09/2020   HGB 10.2 (L) 04/09/2020   HCT 31.2 (L) 04/09/2020   MCV 89.9 04/09/2020   PLT 196 04/09/2020   CMP Latest Ref Rng & Units 04/09/2020  Glucose 70 - 99 mg/dL 75  BUN 6 - 20 mg/dL 8  Creatinine 0.44 - 1.00 mg/dL 0.45  Sodium 135 - 145 mmol/L 135  Potassium 3.5 - 5.1 mmol/L 3.8  Chloride 98 - 111 mmol/L 106  CO2 22 - 32 mmol/L 18(L)  Calcium 8.9 - 10.3 mg/dL 8.9  Total Protein 6.5 - 8.1 g/dL 6.1(L)  Total Bilirubin 0.3 - 1.2 mg/dL 0.4  Alkaline Phos 38 - 126 U/L 202(H)  AST 15 - 41 U/L 18  ALT 0 - 44 U/L 11   Edinburgh Score: Edinburgh Postnatal Depression Scale Screening Tool 04/10/2020  I have been able to laugh and see the funny side of things. 0  I have looked forward with enjoyment to things. 0  I have blamed myself unnecessarily when things went wrong. 0  I have been anxious or worried for no good reason. 0  I have felt scared or panicky for no good reason. 0  Things  have been getting on top of me. 0  I have been so unhappy that I have had difficulty sleeping. 0  I have felt sad or miserable. 0  I have been so unhappy that I have been crying. 1  The thought of harming myself has occurred to me. 0  Edinburgh Postnatal Depression Scale Total 1     After visit meds:  Allergies as of 04/11/2020   No Known Allergies     Medication List    STOP taking these medications   Accu-Chek Guide test strip Generic drug: glucose blood   Accu-Chek Softclix Lancets lancets     TAKE these medications   acetaminophen 325 MG tablet Commonly known as: Tylenol Take 2 tablets (650 mg total) by mouth every 6 (six) hours as needed (for pain scale < 4).   amLODipine 5 MG tablet Commonly known  as: NORVASC Take 1 tablet (5 mg total) by mouth daily.   Blood Pressure Kit Devi 1 Device by Does not apply route as needed. ICD 10:Z34.00   ibuprofen 600 MG tablet Commonly known as: ADVIL Take 1 tablet (600 mg total) by mouth every 6 (six) hours.   Prenatal Vitamin 27-0.8 MG Tabs Take 1 tablet by mouth daily.        Discharge home in stable condition Infant Feeding: Bottle Infant Disposition:home with mother Discharge instruction: per After Visit Summary and Postpartum booklet. Activity: Advance as tolerated. Pelvic rest for 6 weeks.  Diet: routine diet Future Appointments:No future appointments. Follow up Visit:  [place] [facility]. Schedule an appointment as soon as possible for a visit in 1 week(s).   Specialty: Obstetrics and Gynecology Contact information: [place] [address] [phone]               Please schedule this patient for a In person postpartum visit in 6 weeks with the following provider: Any provider. Additional Postpartum F/U:BP check 1 week and 2 hour gtt at 6 wk appointment, IUD placement  High risk pregnancy complicated by: T0YPE, ghtn Delivery mode:  Vaginal, Spontaneous  Anticipated Birth Control:  IUD   04/11/2020 Hansel Feinstein, CNM

## 2020-04-10 NOTE — Progress Notes (Signed)
CSW aware that MOB is from [facility] [employer]. CSW followed up with  MOB to confirm information and to send inflation back to [facility] as requested.    Claude Manges Mafalda Mcginniss, MSW, LCSW [facility] [phone]

## 2020-04-11 ENCOUNTER — Other Ambulatory Visit (HOSPITAL_COMMUNITY): Payer: Self-pay | Admitting: Family Medicine

## 2020-04-11 LAB — GLUCOSE, CAPILLARY: Glucose-Capillary: 73 mg/dL (ref 70–99)

## 2020-04-11 MED ORDER — AMLODIPINE BESYLATE 5 MG PO TABS: 5.0000 mg | ORAL_TABLET | Freq: Every day | ORAL | 11 refills | Status: DC

## 2020-04-11 MED ORDER — IBUPROFEN 600 MG PO TABS: 600.0000 mg | ORAL_TABLET | Freq: Four times a day (QID) | ORAL | 0 refills | Status: DC

## 2020-04-11 MED FILL — AMLODIPINE BESYLATE 5 MG TA: 5 | 30 days supply | Qty: 30 | Fill #0

## 2020-04-11 MED FILL — IBUPROFEN 600 MG TABLET: 600 | 7 days supply | Qty: 30 | Fill #0

## 2020-04-11 NOTE — Discharge Instructions (Signed)

## 2020-04-11 NOTE — Progress Notes (Signed)
AVS printed and discharged instructions given to patient. Patient aware of1wk BP follow-up appoinmtment and to call for 6wks follow-up appointment. Patient instructed to pick up prescriptions. All questions answered and pt verbalized understanding.

## 2020-04-18 ENCOUNTER — Other Ambulatory Visit: Payer: Self-pay

## 2020-04-18 ENCOUNTER — Ambulatory Visit (INDEPENDENT_AMBULATORY_CARE_PROVIDER_SITE_OTHER): Payer: Medicaid Other | Admitting: General Practice

## 2020-04-18 VITALS — BP 123/70 | HR 101 | Ht 67.0 in | Wt 196.0 lb

## 2020-04-18 DIAGNOSIS — Z0130 Encounter for examination of blood pressure without abnormal findings: Secondary | ICD-10-CM

## 2020-04-18 NOTE — Progress Notes (Signed)
Patient presents to office today for blood pressure check following SVD on 12/22. Patient was induced for gestational hypertension and sent home on Norvasc 5mg . She denies headaches, dizziness, or blurry vision. No edema noted. BP 123/70. Told patient to continue taking Norvasc and follow up at scheduled pp visit on 1/27.  2/27 RN BSN 04/18/20

## 2020-04-22 NOTE — Progress Notes (Signed)
Patient was assessed and managed by nursing staff during this encounter. I have reviewed the chart and agree with the documentation and plan. I have also made any necessary editorial changes.  Warden Fillers, MD 04/22/2020 11:46 AM

## 2020-05-16 ENCOUNTER — Ambulatory Visit: Payer: Medicaid Other | Admitting: Certified Nurse Midwife

## 2020-05-16 ENCOUNTER — Other Ambulatory Visit: Payer: Self-pay

## 2020-05-16 ENCOUNTER — Other Ambulatory Visit: Payer: Medicaid Other

## 2020-05-16 DIAGNOSIS — O24419 Gestational diabetes mellitus in pregnancy, unspecified control: Secondary | ICD-10-CM

## 2020-05-29 ENCOUNTER — Encounter: Payer: Self-pay | Admitting: Advanced Practice Midwife

## 2020-05-29 ENCOUNTER — Other Ambulatory Visit: Payer: Medicaid Other

## 2020-05-29 ENCOUNTER — Ambulatory Visit (INDEPENDENT_AMBULATORY_CARE_PROVIDER_SITE_OTHER): Payer: Medicaid Other | Admitting: Advanced Practice Midwife

## 2020-05-29 ENCOUNTER — Other Ambulatory Visit: Payer: Self-pay

## 2020-05-29 DIAGNOSIS — R87612 Low grade squamous intraepithelial lesion on cytologic smear of cervix (LGSIL): Secondary | ICD-10-CM | POA: Diagnosis not present

## 2020-05-29 DIAGNOSIS — Z3202 Encounter for pregnancy test, result negative: Secondary | ICD-10-CM | POA: Diagnosis not present

## 2020-05-29 DIAGNOSIS — Z8759 Personal history of other complications of pregnancy, childbirth and the puerperium: Secondary | ICD-10-CM | POA: Diagnosis not present

## 2020-05-29 MED ORDER — LEVONORGESTREL-ETHINYL ESTRAD 0.1-20 MG-MCG PO TABS: 1.0000 | ORAL_TABLET | Freq: Every day | ORAL | 11 refills | Status: DC

## 2020-05-29 NOTE — Progress Notes (Signed)
Post Partum Visit Note  Christine Bailey is a 25 y.o. G45P2002 female who presents for a postpartum visit. She is 7 weeks postpartum following a normal spontaneous vaginal delivery.  I have fully reviewed the prenatal and intrapartum course. The delivery was at [redacted]w[redacted]d.  Anesthesia: none. Postpartum course has been uncomplicated. Baby is doing well. Baby is feeding by bottle - Lucien [facility] Start Gentle. Bleeding no bleeding. Bowel function is normal. Bladder function is normal. Patient is sexually active. Contraception method is OCP (estrogen/progesterone). Postpartum depression screening: negative.   The pregnancy intention screening data noted above was reviewed. Potential methods of contraception were discussed. The patient elected to proceed with Oral Contraceptive.    Edinburgh Postnatal Depression Scale - 05/29/20 1003      Edinburgh Postnatal Depression Scale:  In the Past 7 Days   I have been able to laugh and see the funny side of things. 0    I have looked forward with enjoyment to things. 0    I have blamed myself unnecessarily when things went wrong. 0    I have been anxious or worried for no good reason. 0    I have felt scared or panicky for no good reason. 0    Things have been getting on top of me. 0    I have been so unhappy that I have had difficulty sleeping. 0    I have felt sad or miserable. 0    I have been so unhappy that I have been crying. 0    The thought of harming myself has occurred to me. 0    Edinburgh Postnatal Depression Scale Total 0            The following portions of the patient's history were reviewed and updated as appropriate: allergies, current medications, past family history, past medical history, past social history, past surgical history and problem list.  Review of Systems Pertinent items are noted in HPI.    Objective:  BP 138/81   Pulse 82   Wt 203 lb 11.2 oz (92.4 kg)   LMP  (LMP Unknown)   BMI 31.90 kg/m    Physical  Exam  Assessment:   1. Postpartum care and examination    Pap smear due in June 2022 due to LGSIL in 2021  Plan:   Essential components of care per ACOG recommendations:  1.  Mood and well being: Patient with negative depression screening today. Reviewed local resources for support.  - Patient does use tobacco. If using tobacco we discussed reduction and for recently cessation risk of relapse - hx of drug use? No   If yes, discussed support systems  2. Infant care and feeding:  -Patient currently breastmilk feeding? No  If breastmilk feeding discussed return to work and pumping. If needed, patient was provided letter for work to allow for every 2-3 hr pumping breaks, and to be granted a private location to express breastmilk and refrigerated area to store breastmilk. Reviewed importance of draining breast regularly to support lactation. -Social determinants of health (SDOH) reviewed in EPIC. No concerns  3. Sexuality, contraception and birth spacing - Patient does not want a pregnancy in the next year.  Desired family size is 3 children.  - Reviewed forms of contraception in tiered fashion. Patient desired oral contraceptives (estrogen/progesterone) today.   - Discussed birth spacing of 18 months  4. Sleep and fatigue -Encouraged family/partner/community support of 4 hrs of uninterrupted sleep to help with mood and fatigue  5. Physical Recovery  - Discussed patients delivery and complications - Patient had a NA degree laceration, perineal healing reviewed. Patient expressed understanding - Patient has urinary incontinence? No - Patient is safe to resume physical and sexual activity  6.  Health Maintenance - Last pap smear done 09/2019 and was abnormal with LGSIL with negative HPV. - Patient did not get 2 hour GTT done today due to long wait from not getting on the lab schedule. She will return at a later date and complete the 2 hour.     Thressa Sheller DNP, CNM  05/29/20  10:56  AM

## 2020-05-29 NOTE — Patient Instructions (Signed)
Oral Contraception Information Oral contraceptive pills (OCPs) are medicines taken by mouth to prevent pregnancy. They work by:  Preventing the ovaries from releasing eggs.  Thickening mucus in the lower part of the uterus (cervix). This prevents sperm from entering the uterus.  Thinning the lining of the uterus (endometrium). This prevents a fertilized egg from attaching to the endometrium. OCPs are highly effective when taken exactly as prescribed. However, OCPs do not prevent STIs (sexually transmitted infections). Using condoms while on an OCP can help prevent STIs. What happens before starting OCPs? Before you start taking OCPs:  You may have a physical exam, blood test, and Pap test.  Your health care provider will make sure you are a good candidate for oral contraception. OCPs are not a good option for certain women, such as: ? Women who smoke and are older than age 35. ? Women who have or have had certain conditions, such as:  A history of high blood pressure.  Deep vein thrombosis.  Pulmonary embolism.  Stroke.  Cardiovascular disease.  Peripheral vascular disease. Ask your health care provider about the possible side effects of the OCP you may be prescribed. Be aware that it can take 2-3 months for your body to adjust to changes in hormone levels. Types of oral contraception Birth control pills contain the hormones estrogen and progestin (synthetic progesterone) or progestin only. The combination pill This type of pill contains estrogen and progestin hormones.  Conventional contraception pills come in packs of 21 or 28 pills. ? Some packs with 28-day pills contain estrogen and progestin for the first 21-24 days. Hormone-free tablets, called placebos, are taken for the final 4-7 days. You should have menstrual bleeding during the time you take the placebos. ? In packs with 21 tablets, you take no pills for 7 days. Menstrual bleeding occurs during these days. (Some people  prefer taking a pill for 28 days to help establish a routine).  Extended-interval contraception pills come in packs of 91 pills. The first 84 tablets have both estrogen and progestin. The last 7 pills are placebos. Menstrual bleeding occurs during the placebo days. With this schedule, menstrual bleeding happens once every 3 months.  Continuous contraception pills come in packs of 28 pills. All pills in the pack contain estrogen and progestin. With this schedule, regular menstrual bleeding does not happen, but there may be spotting or irregular bleeding. Progestin-only pills This type of pill is often called the mini-pill and contains the progestin hormone only. It comes in packs of 28 pills. In some packs, the last 4 pills are placebos. The pill must be taken at the same time every day. This is very important to prevent pregnancy. Menstrual bleeding may not be regular or predictable.   What are the advantages? Oral contraception provides reliable and continuous contraception if taken as directed. It may treat or decrease symptoms of:  Menstrual period cramps.  Irregular menstrual cycle or bleeding.  Heavy menstrual flow.  Abnormal uterine bleeding.  Acne, depending on the type of pill.  Polycystic ovarian syndrome (POS).  Endometriosis.  Iron deficiency anemia.  Premenstrual symptoms, including severe irritability, depression, or anxiety. It also may:  Reduce the risk of endometrial and ovarian cancer.  Be used as emergency contraception.  Prevent ectopic pregnancies and infections of the fallopian tubes. What can make OCPs less effective? OCPs may be less effective if:  You forget to take the pill every day. For progestin-only pills, it is especially important to take the pill at the   same time each day. Even taking it 3 hours late can increase the risk of pregnancy.  You have a stomach or intestinal disease that reduces your body's ability to absorb the pill.  You take OCPs  with other medicines that make OCPs less effective, such as antibiotics, certain HIV medicines, and some seizure medicines.  You take expired OCPs.  You forget to restart the pill after 7 days of not taking it. This refers to the packs of 21 pills. What are the side effects and risks? OCPs can sometimes cause side effects, such as:  Headache.  Depression.  Trouble sleeping.  Nausea and vomiting.  Breast tenderness.  Irregular bleeding or spotting during the first several months.  Bloating or fluid retention.  Increase in blood pressure. Combination pills may slightly increase the risk of:  Blood clots.  Heart attack.  Stroke. Follow these instructions at home: Follow instructions from your health care provider about how to start taking your first cycle of OCPs. Depending on when you start the pill, you may need to use a backup form of birth control, such as condoms, during the first week. Make sure you know what steps to take if you forget to take the pill. Summary  Oral contraceptive pills (OCPs) are medicines taken by mouth to prevent pregnancy. They are highly effective when taken exactly as prescribed.  OCPs contain a combination of the hormones estrogen and progestin (synthetic progesterone) or progestin only.  Before you start taking the pill, you may have a physical exam, blood test, and Pap test. Your health care provider will make sure you are a good candidate for oral contraception.  The combination pill may come in a 21-day pack, a 28-day pack, or a 91-day pack. Progestin-only pills come in packs of 28 pills.  OCPs can sometimes cause side effects, such as headache, nausea, breast tenderness, or irregular bleeding. This information is not intended to replace advice given to you by your health care provider. Make sure you discuss any questions you have with your health care provider. Document Revised: 01/05/2020 Document Reviewed: 12/14/2019 Elsevier Patient  Education  2021 [employer]  

## 2020-05-31 ENCOUNTER — Telehealth: Payer: Self-pay | Admitting: Family Medicine

## 2020-05-31 ENCOUNTER — Other Ambulatory Visit: Payer: Medicaid Other

## 2020-05-31 LAB — POCT PREGNANCY, URINE: Preg Test, Ur: NEGATIVE

## 2020-05-31 NOTE — Telephone Encounter (Signed)
I called pt to resch 2hr and she answered the phone and when I advised to resch she hung up, I tried to call pt back but got vm.  I left vm for pt to call back to resch.

## 2020-07-25 ENCOUNTER — Other Ambulatory Visit: Payer: Self-pay

## 2020-07-25 ENCOUNTER — Ambulatory Visit
Admission: EM | Admit: 2020-07-25 | Discharge: 2020-07-25 | Disposition: A | Discharge status: Home / Self Care | Payer: Medicaid Other | Attending: Physician Assistant | Admitting: Physician Assistant

## 2020-07-25 DIAGNOSIS — Z202 Contact with and (suspected) exposure to infections with a predominantly sexual mode of transmission: Secondary | ICD-10-CM | POA: Insufficient documentation

## 2020-07-25 DIAGNOSIS — Z113 Encounter for screening for infections with a predominantly sexual mode of transmission: Secondary | ICD-10-CM | POA: Diagnosis not present

## 2020-07-25 MED ORDER — METRONIDAZOLE 500 MG PO TABS: 500.0000 mg | ORAL_TABLET | Freq: Two times a day (BID) | ORAL | 0 refills | Status: DC

## 2020-07-25 NOTE — ED Triage Notes (Signed)
Pt is here wanting STD testing after having unprotected sex on Tuesday, pt's partner informed her yesterday he tested Trichomas.

## 2020-07-25 NOTE — Discharge Instructions (Signed)
Take antibiotic twice a day for one week. You cannot drink any alcohol with this medication as it will cause you to vomit. If you develop any symptoms such as pelvic pain, fever, nausea/vomiting come back to see [place]. You can get this again so all partners need to be tested and treated.

## 2020-07-25 NOTE — ED Provider Notes (Signed)
[facility]    CSN: [id number] Arrival date & time: 07/25/20  0845      History   Chief Complaint Chief Complaint  Patient presents with  . S74.5    HPI Christine Bailey is a 25 y.o. female.   Christine Bailey presents today requesting STI testing. Her significant other recently tested positive for trichomonas and she has been sexually active with him and not using protection. She is requesting treatment today. She denies any symptoms including fever, abdominal pain, nausea, vomiting, vaginal discharge, pelvic pain. She denies history of STI. Denies any recent antibiotic use. She is not breast feeding.      Past Medical History:  Diagnosis Date  . Chlamydia   . Eczema   . Gestational diabetes   . Supervision of high risk pregnancy, antepartum 10/10/2019    Nursing Staff Provider Office Location  [facility] Dating    1st trimester [place] Language   English Anatomy [place]   normal Flu Vaccine  Declined  Genetic Screen  NIPS:  Low risk female  AFP:  Screen negative   TDaP vaccine   03/05/20 Hgb A1C or  GTT Early  Third trimester:    Ref. Range 01/23/2020 11:16 Glucose, 1 hour Latest Ref Range: 65 - 179 mg/dL 150 Glucose, Fasting Latest Ref Range: 65 - 91 mg/dL 92   . Vaginal delivery 04/10/2020    Patient Active Problem List   Diagnosis Date Noted  . History of gestational hypertension 04/10/2020  . History of gestational diabetes 04/09/2020  . 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 Genetic carrier 10/17/2019  . Short interval between pregnancies affecting pregnancy, antepartum 10/10/2019  . Eczema 11/01/2018    Past Surgical History:  Procedure Laterality Date  . NO PAST SURGERIES      OB History    Gravida  2   Para  2   Term  2   Preterm      AB      Living  2     SAB      IAB      Ectopic      Multiple  0   Live Births  2            Home Medications    Prior to Admission medications   Medication Sig Start Date End Date Taking? Authorizing  Provider  metroNIDAZOLE (FLAGYL) 500 MG tablet Take 1 tablet (500 mg total) by mouth 2 (two) times daily. 07/25/20  Yes Delores Edelstein, Derry Skill, PA-C  Blood Pressure Monitoring (BLOOD PRESSURE KIT) DEVI 1 Device by Does not apply route as needed. ICD 10:Z34.00 12/06/18   Starr Lake, CNM  ibuprofen (ADVIL) 600 MG tablet Take 1 tablet (600 mg total) by mouth every 6 (six) hours. 04/11/20   Seabron Spates, CNM  ibuprofen (ADVIL) 600 MG tablet TAKE 1 TABLET BY MOUTH EVERY 6 HOURS 04/11/20 04/11/21  Donnamae Jude, MD  levonorgestrel-ethinyl estradiol (ALESSE) 0.1-20 MG-MCG tablet Take 1 tablet by mouth daily. 05/29/20   Marcille Buffy D, CNM  amLODipine (NORVASC) 5 MG tablet TAKE 1 TABLET (5 MG TOTAL) BY MOUTH DAILY. Patient not taking: No sig reported 04/11/20 07/25/20  Seabron Spates, CNM    Family History Family History  Adopted: Yes  Problem Relation Age of Onset  . Cancer Mother        Breast    Social History Social History   Tobacco Use  . Smoking status: Current Every Day Smoker  Types: Cigarettes  . Smokeless tobacco: Never Used  . Tobacco comment: 3 cigarettes / day  Vaping Use  . Vaping Use: Never used  Substance Use Topics  . Alcohol use: Yes  . Drug use: Yes    Types: Marijuana     Allergies   Patient has no known allergies.   Review of Systems Review of Systems  Constitutional: Negative for activity change, appetite change, fatigue and fever.  Respiratory: Negative for cough and shortness of breath.   Cardiovascular: Negative for chest pain.  Gastrointestinal: Negative for abdominal pain, diarrhea, nausea and vomiting.  Genitourinary: Negative for dysuria, frequency, urgency, vaginal bleeding, vaginal discharge and vaginal pain.  Neurological: Negative for dizziness, light-headedness and headaches.     Physical Exam Triage Vital Signs ED Triage Vitals  Enc Vitals Group     BP --      Pulse --      Resp 07/25/20 0911 18     Temp --      Temp  Source 07/25/20 0911 Oral     SpO2 --      Weight 07/25/20 0915 204 lb (92.5 kg)     Height --      Head Circumference --      Peak Flow --      Pain Score 07/25/20 0910 0     Pain Loc --      Pain Edu? --      Excl. in [place]? --    No data found.  Updated Vital Signs Resp 18   Wt 204 lb (92.5 kg)   LMP 07/20/2020   Breastfeeding No   BMI 31.95 kg/m   Visual Acuity Right Eye Distance:   Left Eye Distance:   Bilateral Distance:    Right Eye Near:   Left Eye Near:    Bilateral Near:     Physical Exam Vitals reviewed.  Constitutional:      General: She is awake. She is not in acute distress.    Appearance: Normal appearance. She is not ill-appearing.     Comments: Very pleasant female appears stated age in no acute distress.   HENT:     Head: Normocephalic and atraumatic.  Cardiovascular:     Rate and Rhythm: Normal rate and regular rhythm.     Heart sounds: No murmur heard.   Pulmonary:     Effort: Pulmonary effort is normal.     Breath sounds: Normal breath sounds. No wheezing, rhonchi or rales.     Comments: Clear to auscultation bilaterally  Abdominal:     General: Bowel sounds are normal.     Palpations: Abdomen is soft.     Tenderness: There is no abdominal tenderness. There is no right CVA tenderness, left CVA tenderness, guarding or rebound.  Musculoskeletal:     Right lower leg: No edema.     Left lower leg: No edema.  Psychiatric:        Behavior: Behavior is cooperative.      UC Treatments / Results  Labs (all labs ordered are listed, but only abnormal results are displayed) Labs Reviewed  CERVICOVAGINAL ANCILLARY ONLY    EKG   Radiology No results found.  Procedures Procedures (including critical care time)  Medications Ordered in UC Medications - No data to display  Initial Impression / Assessment and Plan / UC Course  I have reviewed the triage vital signs and the nursing notes.  Pertinent labs & imaging results that were  available during my care  of the patient were reviewed by me and considered in my medical decision making (see chart for details).     NuSwab collected today- results pending. Patient was empirically treated for trichomonas as requested. She was prescribed Flagyl with instruction not to drink any alcohol due to antabuse side effects associated with this medication. Discussed the importance of safe sex. Discussed that all partners will need to be tested and treated. Strict return precautions given to which patient expressed understanding.   Final Clinical Impressions(s) / UC Diagnoses   Final diagnoses:  Exposure to trichomonas  Routine screening for STI (sexually transmitted infection)     Discharge Instructions     Take antibiotic twice a day for one week. You cannot drink any alcohol with this medication as it will cause you to vomit. If you develop any symptoms such as pelvic pain, fever, nausea/vomiting come back to see [place]. You can get this again so all partners need to be tested and treated.     ED Prescriptions    Medication Sig Dispense Auth. Provider   metroNIDAZOLE (FLAGYL) 500 MG tablet Take 1 tablet (500 mg total) by mouth 2 (two) times daily. 14 tablet Dionysios Massman, Derry Skill, PA-C     PDMP not reviewed this encounter.   Terrilee Croak, PA-C 07/25/20 [id number]

## 2020-07-26 LAB — CERVICOVAGINAL ANCILLARY ONLY
Bacterial Vaginitis (gardnerella): POSITIVE — AB
Candida Glabrata: NEGATIVE
Candida Vaginitis: NEGATIVE
Chlamydia: NEGATIVE
Comment: NEGATIVE
Comment: NEGATIVE
Comment: NEGATIVE
Comment: NEGATIVE
Comment: NEGATIVE
Comment: NORMAL
Neisseria Gonorrhea: NEGATIVE
Trichomonas: NEGATIVE

## 2020-11-17 ENCOUNTER — Ambulatory Visit
Admission: EM | Admit: 2020-11-17 | Discharge: 2020-11-17 | Disposition: A | Discharge status: Home / Self Care | Payer: Medicaid Other | Attending: Physician Assistant | Admitting: Physician Assistant

## 2020-11-17 ENCOUNTER — Encounter: Payer: Self-pay | Admitting: Emergency Medicine

## 2020-11-17 ENCOUNTER — Other Ambulatory Visit: Payer: Self-pay

## 2020-11-17 DIAGNOSIS — J029 Acute pharyngitis, unspecified: Secondary | ICD-10-CM

## 2020-11-17 DIAGNOSIS — J069 Acute upper respiratory infection, unspecified: Secondary | ICD-10-CM | POA: Diagnosis not present

## 2020-11-17 DIAGNOSIS — R059 Cough, unspecified: Secondary | ICD-10-CM

## 2020-11-17 DIAGNOSIS — Z20822 Contact with and (suspected) exposure to covid-19: Secondary | ICD-10-CM

## 2020-11-17 DIAGNOSIS — R112 Nausea with vomiting, unspecified: Secondary | ICD-10-CM

## 2020-11-17 DIAGNOSIS — R52 Pain, unspecified: Secondary | ICD-10-CM | POA: Diagnosis not present

## 2020-11-17 DIAGNOSIS — R509 Fever, unspecified: Secondary | ICD-10-CM | POA: Diagnosis not present

## 2020-11-17 DIAGNOSIS — Z1152 Encounter for screening for COVID-19: Secondary | ICD-10-CM

## 2020-11-17 LAB — POCT URINE PREGNANCY: Preg Test, Ur: NEGATIVE

## 2020-11-17 MED ORDER — BENZONATATE 100 MG PO CAPS: 100.0000 mg | ORAL_CAPSULE | Freq: Three times a day (TID) | ORAL | 0 refills | Status: DC

## 2020-11-17 MED ORDER — ONDANSETRON 4 MG PO TBDP: 4.0000 mg | ORAL_TABLET | Freq: Three times a day (TID) | ORAL | 0 refills | Status: DC | PRN

## 2020-11-17 NOTE — ED Provider Notes (Signed)
[facility]    CSN: [id number] Arrival date & time: 11/17/20  1141      History   Chief Complaint Chief Complaint  Patient presents with   Sore Throat   Chills    HPI Christine Bailey is a 25 y.o. female.   Patient presents today with a several day history of URI symptoms.  Reports her throat, chills, body aches, headache, nausea, vomiting, diarrhea, cough.  Denies any chest pain, shortness of breath, abdominal pain, lightheadedness, shortness of breath.  She has not tried any over-the-counter medication for symptom management.  She does report known sick contacts of recently tested positive for COVID-19.  She has had COVID-19 vaccinations but has not yet had booster.  She denies history of asthma, allergies, smoking, COPD.  She denies any significant past medical history.   Past Medical History:  Diagnosis Date   Chlamydia    Eczema    Supervision of high risk pregnancy, antepartum 10/10/2019    Nursing Staff Provider Office Location  [facility] Dating    1st trimester [place] Language   English Anatomy [place]   normal Flu Vaccine  Declined  Genetic Screen  NIPS:  Low risk female  AFP:  Screen negative   TDaP vaccine   03/05/20 Hgb A1C or  GTT Early  Third trimester:    Ref. 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 Authorizing Provider  benzonatate (TESSALON) 100 MG capsule Take 1 capsule (100 mg total) by mouth every 8 (eight) hours. 11/17/20  Yes Brionne Mertz K, PA-C  ondansetron (ZOFRAN ODT) 4 MG disintegrating tablet Take 1 tablet (4 mg total) by mouth every 8 (eight) hours as needed for nausea or vomiting. 11/17/20  Yes Shavaun Osterloh K, PA-C  Blood Pressure Monitoring (BLOOD PRESSURE KIT) DEVI 1 Device by Does not apply route as needed. ICD 10:Z34.00 12/06/18   Starr Lake, CNM  ibuprofen (ADVIL) 600 MG tablet Take 1 tablet (600 mg total) by mouth every 6 (six) hours. 04/11/20   Seabron Spates, CNM  ibuprofen (ADVIL) 600 MG tablet TAKE 1 TABLET BY MOUTH EVERY 6 HOURS 04/11/20 04/11/21  Donnamae Jude, MD  levonorgestrel-ethinyl estradiol (ALESSE) 0.1-20 MG-MCG tablet Take 1 tablet by mouth daily. 05/29/20   Tresea Mall, CNM  metroNIDAZOLE (FLAGYL) 500 MG tablet Take 1 tablet (500 mg total) by mouth 2 (two)  times daily. 07/25/20   Jenai Scaletta K, PA-C  amLODipine (NORVASC) 5 MG tablet TAKE 1 TABLET (5 MG TOTAL) BY MOUTH DAILY. Patient not taking: No sig reported 04/11/20 07/25/20  Seabron Spates, CNM    Family History Family History  Adopted: Yes  Problem Relation Age of Onset   Cancer Mother        Breast    Social History Social History   Tobacco Use   Smoking status: Every Day    Types: Cigarettes   Smokeless tobacco: Never   Tobacco comments:    3 cigarettes / day  Vaping Use   Vaping Use: Never used  Substance Use Topics   Alcohol use: Yes   Drug use: Yes    Types: Marijuana     Allergies   Patient has no known allergies.   Review of Systems Review of Systems  Constitutional:  Positive for activity change, appetite change and fatigue. Negative for fever.  HENT:  Positive for congestion and sore throat. Negative for sinus  pressure and sneezing.   Respiratory:  Positive for cough. Negative for shortness of breath.   Cardiovascular:  Negative for chest pain.  Gastrointestinal:  Positive for diarrhea, nausea and vomiting. Negative for abdominal pain.  Musculoskeletal:  Positive for arthralgias and myalgias.  Neurological:  Positive for headaches. Negative for dizziness and light-headedness.    Physical Exam Triage Vital Signs ED Triage Vitals [11/17/20 1311]  Enc Vitals Group     BP 138/76     Pulse Rate 91     Resp 16     Temp 98 F (36.7 C)     Temp Source Oral     SpO2 98 %     Weight      Height      Head Circumference      Peak Flow      Pain Score 7     Pain Loc      Pain Edu?      Excl. in [place]?    No data found.  Updated Vital Signs BP 138/76 (BP Location: Left Arm)   Pulse 91   Temp 98 F (36.7 C) (Oral)   Resp 16   LMP 11/03/2020   SpO2 98%   Visual Acuity Right Eye Distance:   Left Eye Distance:   Bilateral Distance:    Right Eye Near:   Left Eye Near:    Bilateral Near:     Physical Exam Vitals reviewed.  Constitutional:      General: She is awake. She is not in acute distress.    Appearance: Normal appearance. She is normal weight. She is not ill-appearing.     Comments: Very pleasant female appears stated age no acute distress sitting comfortably in exam room  HENT:     Head: Normocephalic and atraumatic.     Right Ear: Tympanic membrane, ear canal and external ear normal. Tympanic membrane is not erythematous or bulging.     Left Ear: Tympanic membrane, ear canal and external ear normal. Tympanic membrane is not erythematous or bulging.     Nose:     Right Sinus: No maxillary sinus tenderness or frontal sinus tenderness.     Left Sinus: No maxillary sinus tenderness or frontal sinus tenderness.     Mouth/Throat:     Pharynx: Uvula midline. Posterior oropharyngeal erythema present. No oropharyngeal exudate.  Cardiovascular:     Rate and Rhythm: Normal rate and  regular rhythm.     Heart  sounds: Normal heart sounds, S1 normal and S2 normal. No murmur heard. Pulmonary:     Effort: Pulmonary effort is normal.     Breath sounds: Normal breath sounds. No wheezing, rhonchi or rales.     Comments: Clear to auscultation bilaterally Lymphadenopathy:     Head:     Right side of head: No submental, submandibular or tonsillar adenopathy.     Left side of head: No submental, submandibular or tonsillar adenopathy.     Cervical: No cervical adenopathy.  Psychiatric:        Behavior: Behavior is cooperative.     UC Treatments / Results  Labs (all labs ordered are listed, but only abnormal results are displayed) Labs Reviewed  COVID-19, FLU A+B NAA  POCT URINE PREGNANCY    EKG   Radiology No results found.  Procedures Procedures (including critical care time)  Medications Ordered in UC Medications - No data to display  Initial Impression / Assessment and Plan / UC Course  I have reviewed the triage vital signs and the nursing notes.  Pertinent labs & imaging results that were available during my care of the patient were reviewed by me and considered in my medical decision making (see chart for details).      Urine pregnancy test was negative in clinic today.  Patient was prescribed Zofran to be used as needed for nausea and vomiting symptoms with instructed to push fluids and eat a bland diet.  She was given Tessalon to manage cough.  COVID and flu testing pending.  She was given work note with current CDC return to work guidelines.  Recommended that she use over-the-counter medications including Mucinex, Flonase, Tylenol for additional symptom relief.  Recommended rest and drinking plenty of fluids.  Strict return precautions given to which patient expressed understanding.  Final Clinical Impressions(s) / UC Diagnoses   Final diagnoses:  Encounter for screening laboratory testing for COVID-19 virus  Fever, unspecified  Cough  Nausea and  vomiting, intractability of vomiting not specified, unspecified vomiting type  Body aches  Sore throat  Upper respiratory tract infection, unspecified type     Discharge Instructions      Your urine pregnancy test was negative.  We will contact you if your COVID or flu test is positive.  Use Zofran up to 3 times a day as needed for nausea and vomiting.  Use Tessalon up to 3 times a day as needed for cough.  Use over-the-counter medications including Tylenol, ibuprofen, Mucinex, Flonase for additional symptom relief.  Make sure you are drinking plenty of fluid.  If anything worsens please return for reevaluation.     ED Prescriptions     Medication Sig Dispense Auth. Provider   ondansetron (ZOFRAN ODT) 4 MG disintegrating tablet Take 1 tablet (4 mg total) by mouth every 8 (eight) hours as needed for nausea or vomiting. 20 tablet Otilia Kareem K, PA-C   benzonatate (TESSALON) 100 MG capsule Take 1 capsule (100 mg total) by mouth every 8 (eight) hours. 21 capsule Lilja Soland K, PA-C      PDMP not reviewed this encounter.   Terrilee Croak, PA-C 11/17/20 1403

## 2020-11-17 NOTE — ED Triage Notes (Signed)
Sore throat, chills, headache, nausea, vomiting, diarrhea, cough since yesterday. Friend's child recently tested positive for covid, requesting covid test.

## 2020-11-17 NOTE — Discharge Instructions (Addendum)
Your urine pregnancy test was negative.  We will contact you if your COVID or flu test is positive.  Use Zofran up to 3 times a day as needed for nausea and vomiting.  Use Tessalon up to 3 times a day as needed for cough.  Use over-the-counter medications including Tylenol, ibuprofen, Mucinex, Flonase for additional symptom relief.  Make sure you are drinking plenty of fluid.  If anything worsens please return for reevaluation.

## 2020-11-19 LAB — COVID-19, FLU A+B NAA
Influenza A, NAA: NOT DETECTED
Influenza B, NAA: NOT DETECTED
SARS-CoV-2, NAA: NOT DETECTED

## 2021-05-08 ENCOUNTER — Ambulatory Visit
Admission: EM | Admit: 2021-05-08 | Discharge: 2021-05-08 | Disposition: A | Discharge status: Home / Self Care | Payer: Medicaid Other | Attending: Internal Medicine | Admitting: Internal Medicine

## 2021-05-08 ENCOUNTER — Encounter: Payer: Self-pay | Admitting: Emergency Medicine

## 2021-05-08 ENCOUNTER — Other Ambulatory Visit: Payer: Self-pay

## 2021-05-08 DIAGNOSIS — Z202 Contact with and (suspected) exposure to infections with a predominantly sexual mode of transmission: Secondary | ICD-10-CM

## 2021-05-08 DIAGNOSIS — Z3202 Encounter for pregnancy test, result negative: Secondary | ICD-10-CM

## 2021-05-08 DIAGNOSIS — Z113 Encounter for screening for infections with a predominantly sexual mode of transmission: Secondary | ICD-10-CM | POA: Diagnosis not present

## 2021-05-08 LAB — POCT URINE PREGNANCY: Preg Test, Ur: NEGATIVE

## 2021-05-08 NOTE — ED Provider Notes (Signed)
[facility]    CSN: [id number] Arrival date & time: 05/08/21  7619      History   Chief Complaint Chief Complaint  Patient presents with   SEXUALLY TRANSMITTED DISEASE    HPI Christine Bailey is a 26 y.o. female.   Patient presents for routine STD testing.  She denies any known exposure but reports that she does have a new sexual partner so wants to be sure.  Denies any current symptoms.  Patient would like HIV and syphilis testing as well.  She would also like a pregnancy test.  Last menstrual cycle was approximately 3 weeks ago.    Past Medical History:  Diagnosis Date   Chlamydia    Eczema    Supervision of high risk pregnancy, antepartum 10/10/2019    Nursing Staff Provider Office Location  [facility] Dating    1st trimester [place] Language   English Anatomy [place]   normal Flu Vaccine  Declined  Genetic Screen  NIPS:  Low risk female  AFP:  Screen negative   TDaP vaccine   03/05/20 Hgb A1C or  GTT Early  Third trimester:    Ref. 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 Authorizing Provider  benzonatate (TESSALON) 100 MG capsule Take 1 capsule (100 mg total) by mouth every 8 (eight)  hours. 11/17/20   Raspet, Derry Skill, PA-C  Blood Pressure Monitoring (BLOOD PRESSURE KIT) DEVI 1 Device by Does not apply route as needed. ICD 10:Z34.00 12/06/18   Starr Lake, CNM  ibuprofen (ADVIL) 600 MG tablet Take 1 tablet (600 mg total) by mouth every 6 (six) hours. 04/11/20   Seabron Spates, CNM  levonorgestrel-ethinyl estradiol (ALESSE) 0.1-20 MG-MCG tablet Take 1 tablet by mouth daily. 05/29/20   Tresea Mall, CNM  metroNIDAZOLE (FLAGYL) 500 MG tablet Take 1 tablet (500 mg total) by mouth 2 (two) times daily. 07/25/20   Raspet, Junie Panning K, PA-C  ondansetron (ZOFRAN ODT) 4 MG disintegrating tablet Take 1 tablet (4 mg total) by mouth every 8 (eight) hours as needed for nausea or vomiting. 11/17/20   Raspet, Erin K, PA-C  amLODipine (NORVASC) 5 MG tablet TAKE 1 TABLET (5 MG TOTAL) BY MOUTH DAILY. Patient not taking: No sig reported 04/11/20 07/25/20  Seabron Spates, CNM    Family History Family History  Adopted: Yes  Problem Relation Age of Onset  Cancer Mother        Breast    Social History Social History   Tobacco Use   Smoking status: Every Day    Types: Cigarettes   Smokeless tobacco: Never   Tobacco comments:    3 cigarettes / day  Vaping Use   Vaping Use: Never used  Substance Use Topics   Alcohol use: Yes   Drug use: Yes    Types: Marijuana     Allergies   Patient has no known allergies.   Review of Systems Review of Systems Per HPI  Physical Exam Triage Vital Signs ED Triage Vitals [05/08/21 1026]  Enc Vitals Group     BP 117/75     Pulse Rate 80     Resp 16     Temp 98.2 F (36.8 C)     Temp Source Oral     SpO2 98 %     Weight      Height      Head Circumference      Peak Flow      Pain Score 0     Pain Loc      Pain Edu?      Excl. in [place]?    No data found.  Updated Vital Signs BP 117/75 (BP Location: Left Arm)    Pulse 80    Temp 98.2 F (36.8 C) (Oral)    Resp 16    SpO2 98%   Visual Acuity Right Eye Distance:   Left  Eye Distance:   Bilateral Distance:    Right Eye Near:   Left Eye Near:    Bilateral Near:     Physical Exam Constitutional:      General: She is not in acute distress.    Appearance: Normal appearance. She is not toxic-appearing or diaphoretic.  HENT:     Head: Normocephalic and atraumatic.  Eyes:     Extraocular Movements: Extraocular movements intact.     Conjunctiva/sclera: Conjunctivae normal.  Pulmonary:     Effort: Pulmonary effort is normal.  Genitourinary:    Comments: Deferred with shared decision making.  Self swab performed. Neurological:     General: No focal deficit present.     Mental Status: She is alert and oriented to person, place, and time. Mental status is at baseline.  Psychiatric:        Mood and Affect: Mood normal.        Behavior: Behavior normal.        Thought Content: Thought content normal.        Judgment: Judgment normal.     UC Treatments / Results  Labs (all labs ordered are listed, but only abnormal results are displayed) Labs Reviewed  HIV ANTIBODY (ROUTINE TESTING W REFLEX)  RPR  POCT URINE PREGNANCY  CERVICOVAGINAL ANCILLARY ONLY    EKG   Radiology No results found.  Procedures Procedures (including critical care time)  Medications Ordered in UC Medications - No data to display  Initial Impression / Assessment and Plan / UC Course  I have reviewed the triage vital signs and the nursing notes.  Pertinent labs & imaging results that were available during my care of the patient were reviewed by me and considered in my medical decision making (see chart for details).     Routine STD testing pending per patient request.  Cervicovaginal swab pending.  Blood work pending.  Urine pregnancy was negative.  Patient to refrain from sexual activity until test results and treatment  are complete.  Discussed return precautions.  Patient verbalized understanding and was agreeable with plan. Final Clinical Impressions(s) / UC Diagnoses    Final diagnoses:  Screening examination for venereal disease  Pregnancy test negative     Discharge Instructions      Your STD tests are pending.  We will call if there are any abnormalities.  Please refrain from sexual activity until test results and treatment are complete.    ED Prescriptions   None    PDMP not reviewed this encounter.   Teodora Medici, [place] 05/08/21 1108

## 2021-05-08 NOTE — Discharge Instructions (Signed)
Your STD tests are pending.  We will call if there are any abnormalities.  Please refrain from sexual activity until test results and treatment are complete.

## 2021-05-08 NOTE — ED Triage Notes (Addendum)
Requesting STD testing with blood work. Denies symptoms. Also requesting pregnancy test.

## 2021-05-09 ENCOUNTER — Telehealth (HOSPITAL_COMMUNITY): Payer: Self-pay | Admitting: Emergency Medicine

## 2021-05-09 LAB — CERVICOVAGINAL ANCILLARY ONLY
Bacterial Vaginitis (gardnerella): POSITIVE — AB
Candida Glabrata: NEGATIVE
Candida Vaginitis: NEGATIVE
Chlamydia: NEGATIVE
Comment: NEGATIVE
Comment: NEGATIVE
Comment: NEGATIVE
Comment: NEGATIVE
Comment: NEGATIVE
Comment: NORMAL
Neisseria Gonorrhea: NEGATIVE
Trichomonas: NEGATIVE

## 2021-05-09 LAB — RPR: RPR Ser Ql: NONREACTIVE

## 2021-05-09 LAB — HIV ANTIBODY (ROUTINE TESTING W REFLEX): HIV Screen 4th Generation wRfx: NONREACTIVE

## 2021-05-09 MED ORDER — METRONIDAZOLE 500 MG PO TABS: 500.0000 mg | ORAL_TABLET | Freq: Two times a day (BID) | ORAL | 0 refills | Status: DC

## 2021-10-27 ENCOUNTER — Ambulatory Visit
Admission: EM | Admit: 2021-10-27 | Discharge: 2021-10-27 | Disposition: A | Discharge status: Home / Self Care | Payer: Medicaid Other | Attending: Internal Medicine | Admitting: Internal Medicine

## 2021-10-27 ENCOUNTER — Encounter: Payer: Self-pay | Admitting: Emergency Medicine

## 2021-10-27 DIAGNOSIS — Z113 Encounter for screening for infections with a predominantly sexual mode of transmission: Secondary | ICD-10-CM | POA: Diagnosis not present

## 2021-10-27 DIAGNOSIS — N898 Other specified noninflammatory disorders of vagina: Secondary | ICD-10-CM | POA: Insufficient documentation

## 2021-10-27 DIAGNOSIS — Z3202 Encounter for pregnancy test, result negative: Secondary | ICD-10-CM | POA: Diagnosis not present

## 2021-10-27 LAB — POCT URINALYSIS DIP (MANUAL ENTRY)
Bilirubin, UA: NEGATIVE
Glucose, UA: NEGATIVE mg/dL
Ketones, POC UA: NEGATIVE mg/dL
Nitrite, UA: NEGATIVE
Protein Ur, POC: NEGATIVE mg/dL
Spec Grav, UA: >=1.03 — AB (ref 1.010–1.025)
Urobilinogen, UA: 0.2 E.U./dL
pH, UA: 5.5 (ref 5.0–8.0)

## 2021-10-27 LAB — POCT URINE PREGNANCY: Preg Test, Ur: NEGATIVE

## 2021-10-27 NOTE — Discharge Instructions (Addendum)
Your tests are pending.  We will call if they are abnormal and follow-up.  Please follow-up if symptoms persist or worsen.

## 2021-10-27 NOTE — ED Provider Notes (Addendum)
[facility]    CSN: [id number] Arrival date & time: 10/27/21  0934      History   Chief Complaint Chief Complaint  Patient presents with   Dysuria    HPI Christine Bailey is a 26 y.o. female.   Patient presents with white vaginal discharge and vaginal irritation that started about 2 days ago.  Denies any dysuria, urinary frequency, urinary burning, hematuria, pelvic pain, abdominal pain, back pain, fever.  Denies any known exposure to STD.  Patient reports that she last had unprotected sexual intercourse about a month ago so she is not sure if this is related but would like STD testing including blood work for HIV and syphilis.  Denies any recent antibiotic therapy. Last menstrual cycle was 1 month ago.      Past Medical History:  Diagnosis Date   Chlamydia    Eczema    Supervision of high risk pregnancy, antepartum 10/10/2019    Nursing Staff Provider Office Location  [facility] Dating    1st trimester [place] Language   English Anatomy US   normal Flu Vaccine  Declined  Genetic Screen  NIPS:  Low risk female  AFP:  Screen negative   TDaP vaccine   03/05/20 Hgb A1C or  GTT Early  Third trimester:    Ref. 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 Authorizing Provider  levonorgestrel-ethinyl estradiol (ALESSE) 0.1-20 MG-MCG tablet Take 1 tablet by mouth daily. 05/29/20  Yes Marcille Buffy D, CNM  benzonatate (TESSALON) 100 MG capsule Take 1 capsule (100 mg total) by mouth every 8 (eight) hours. 11/17/20   Raspet, Derry Skill, PA-C  Blood Pressure Monitoring (BLOOD PRESSURE KIT) DEVI 1 Device by Does not apply route as needed. ICD 10:Z34.00 12/06/18   Starr Lake, CNM  ibuprofen (ADVIL) 600 MG tablet Take 1 tablet (600 mg total) by mouth every 6 (six) hours. 04/11/20   Seabron Spates, CNM  metroNIDAZOLE (FLAGYL) 500 MG tablet Take 1 tablet (500 mg total) by mouth 2 (two) times daily. 05/09/21   Chase Picket, MD  ondansetron (ZOFRAN ODT) 4 MG disintegrating tablet Take 1 tablet (4 mg total) by mouth every 8 (eight) hours as needed for nausea or vomiting. 11/17/20   Raspet, Junie Panning K, PA-C  amLODipine (NORVASC) 5 MG tablet TAKE 1 TABLET (5 MG TOTAL) BY MOUTH DAILY. Patient not  taking: No sig reported 04/11/20 07/25/20  Seabron Spates, CNM    Family History Family History  Adopted: Yes  Problem Relation Age of Onset   Cancer Mother        Breast    Social History Social History   Tobacco Use   Smoking status: Every Day    Types: Cigarettes   Smokeless tobacco: Never   Tobacco comments:    3 cigarettes / day  Vaping Use   Vaping Use: Never used  Substance Use Topics   Alcohol use: Yes   Drug use: Yes    Types: Marijuana     Allergies   Patient has no known allergies.   Review of Systems Review of Systems Per HPI  Physical Exam Triage Vital Signs ED Triage Vitals [10/27/21 1021]  Enc Vitals Group     BP 131/82     Pulse Rate 65     Resp 18     Temp 99 F (37.2 C)     Temp Source Oral     SpO2 98 %     Weight 195 lb (88.5 kg)     Height $Remov'5\' 7"'NqUtGp$  (1.702 m)     Head Circumference      Peak Flow      Pain Score 0     Pain Loc       Pain Edu?      Excl. in [place]?    No data found.  Updated Vital Signs BP 131/82 (BP Location: Right Arm)   Pulse 65   Temp 99 F (37.2 C) (Oral)   Resp 18   Ht $R'5\' 7"'hF$  (1.702 m)   Wt 195 lb (88.5 kg)   LMP 09/27/2021   SpO2 98%   Breastfeeding No   BMI 30.54 kg/m   Visual Acuity Right Eye Distance:   Left Eye Distance:   Bilateral Distance:    Right Eye Near:   Left Eye Near:    Bilateral Near:     Physical Exam Constitutional:      General: She is not in acute distress.    Appearance: Normal appearance. She is not toxic-appearing or diaphoretic.  HENT:     Head: Normocephalic and atraumatic.  Eyes:     Extraocular Movements: Extraocular movements intact.     Conjunctiva/sclera: Conjunctivae normal.  Pulmonary:     Effort: Pulmonary effort is normal.  Genitourinary:    Comments: Deferred with shared decision making.  Self swab performed. Neurological:     General: No focal deficit present.     Mental Status: She is alert and oriented to person, place, and time. Mental status is at baseline.  Psychiatric:        Mood and Affect: Mood normal.        Behavior: Behavior normal.        Thought Content: Thought content normal.        Judgment: Judgment normal.      UC Treatments / Results  Labs (all labs ordered are listed, but only abnormal results are displayed) Labs Reviewed  POCT URINALYSIS DIP (MANUAL ENTRY) - Abnormal; Notable for the following components:      Result Value   Spec Grav, UA >=1.030 (*)    Blood, UA trace-intact (*)    Leukocytes, UA Moderate (2+) (*)    All other components within normal limits  URINE CULTURE  RPR  HIV ANTIBODY (ROUTINE TESTING W REFLEX)  POCT URINE PREGNANCY  CERVICOVAGINAL ANCILLARY ONLY  EKG   Radiology No results found.  Procedures Procedures (including critical care time)  Medications Ordered in UC Medications - No data to display  Initial Impression / Assessment and Plan / UC Course  I have reviewed  the triage vital signs and the nursing notes.  Pertinent labs & imaging results that were available during my care of the patient were reviewed by me and considered in my medical decision making (see chart for details).     UA completed prior to provider examination due to nursing protocol. UA shows moderate leukocytes but do not think that this is UTI related given no current urinary symptoms.  Patient has vaginal discharge so do think that leukocytes could be present in urine due to vaginitis.  Will await urine culture and cervicovaginal swab for treatment at this time.  Differential diagnoses include bacterial vaginosis versus vaginal yeast.  Patient to refrain from sexual activity until test results and treatment are complete.  Discussed return precautions.  Patient verbalized understanding and was agreeable with plan. Final Clinical Impressions(s) / UC Diagnoses   Final diagnoses:  Vaginal discharge  Screening examination for venereal disease     Discharge Instructions      Your tests are pending.  We will call if they are abnormal and follow-up.  Please follow-up if symptoms persist or worsen.    ED Prescriptions   None    PDMP not reviewed this encounter.   Teodora Medici, [place] 10/27/21 [place], [place], [place] 10/27/21 1102

## 2021-10-27 NOTE — ED Triage Notes (Signed)
Patient c/o possible UTI, dysuria and clumpy white vaginal discharge x 2 days.  Patient requesting STI testing as well.  Denies any OTC meds.

## 2021-10-28 ENCOUNTER — Telehealth (HOSPITAL_COMMUNITY): Payer: Self-pay | Admitting: Emergency Medicine

## 2021-10-28 LAB — URINE CULTURE

## 2021-10-28 LAB — CERVICOVAGINAL ANCILLARY ONLY
Bacterial Vaginitis (gardnerella): NEGATIVE
Candida Glabrata: NEGATIVE
Candida Vaginitis: POSITIVE — AB
Chlamydia: NEGATIVE
Comment: NEGATIVE
Comment: NEGATIVE
Comment: NEGATIVE
Comment: NEGATIVE
Comment: NEGATIVE
Comment: NORMAL
Neisseria Gonorrhea: NEGATIVE
Trichomonas: NEGATIVE

## 2021-10-28 LAB — HIV ANTIBODY (ROUTINE TESTING W REFLEX): HIV Screen 4th Generation wRfx: NONREACTIVE

## 2021-10-28 LAB — RPR: RPR Ser Ql: NONREACTIVE

## 2021-10-28 MED ORDER — FLUCONAZOLE 150 MG PO TABS: 150.0000 mg | ORAL_TABLET | Freq: Once | ORAL | 0 refills | Status: AC

## 2021-11-01 IMAGING — US US MFM OB COMP +14 WKS
1 series · 13 of 28 positions shown · non-contrast
Comparison: none

[Series 1: us mfm ob comp +14 wks · 13 of 68 slices shown]
[im 3/68]
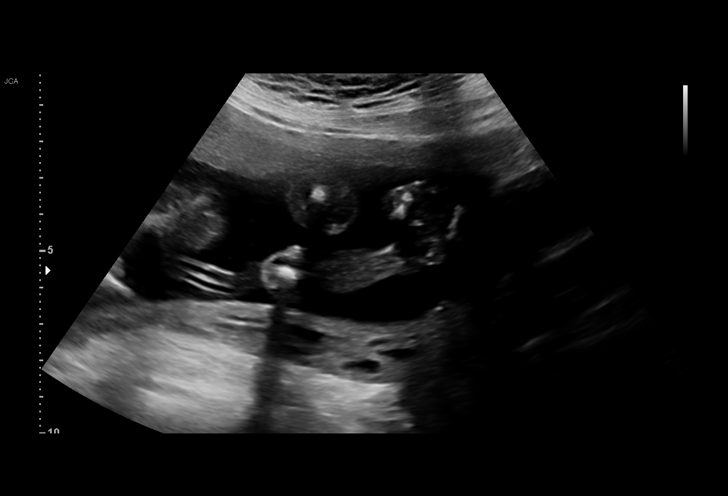
[im 8/68]
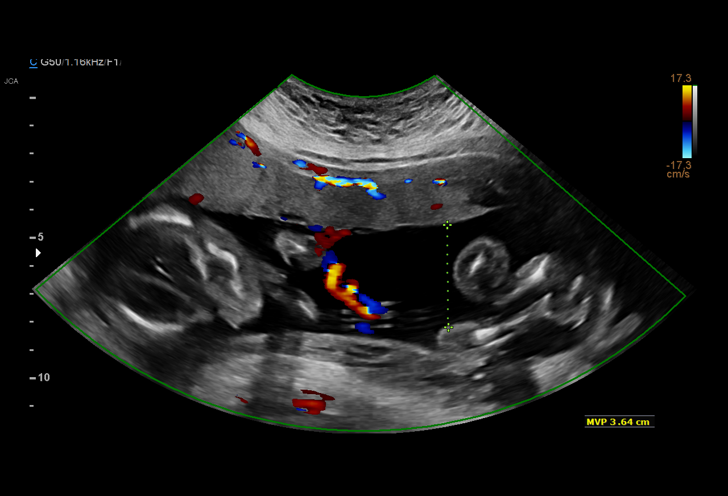
[im 13/68]
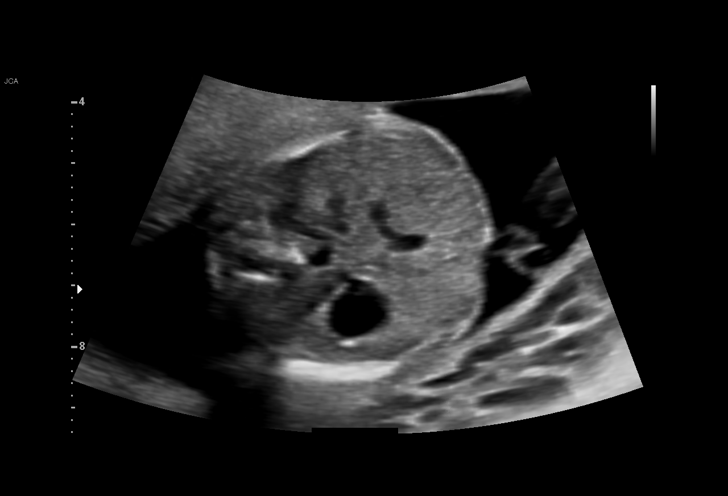
[im 18/68]
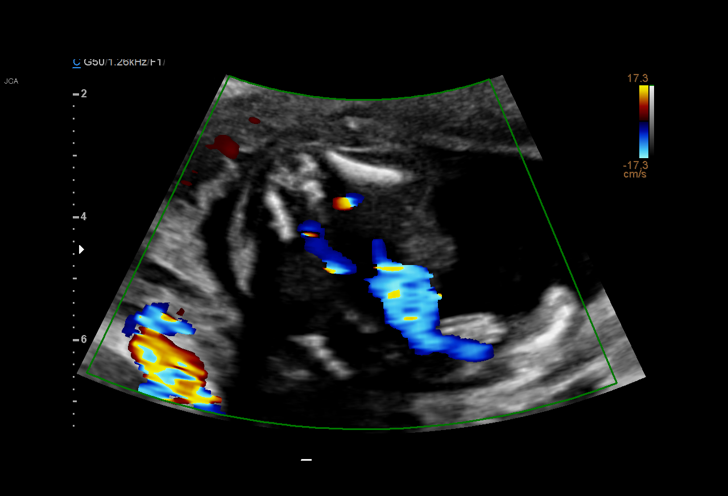
[im 23/68]
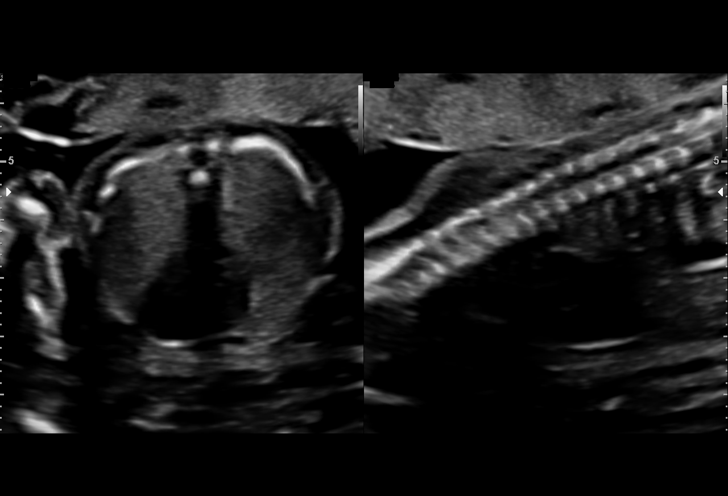
[im 28/68]
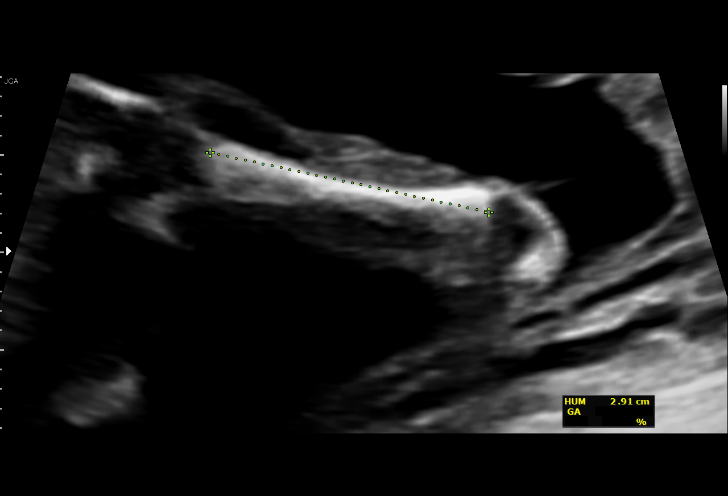
[im 35/68]
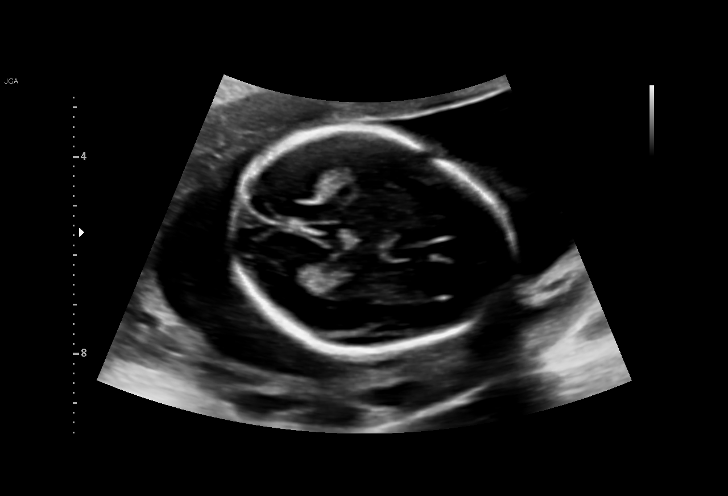
[im 40/68]
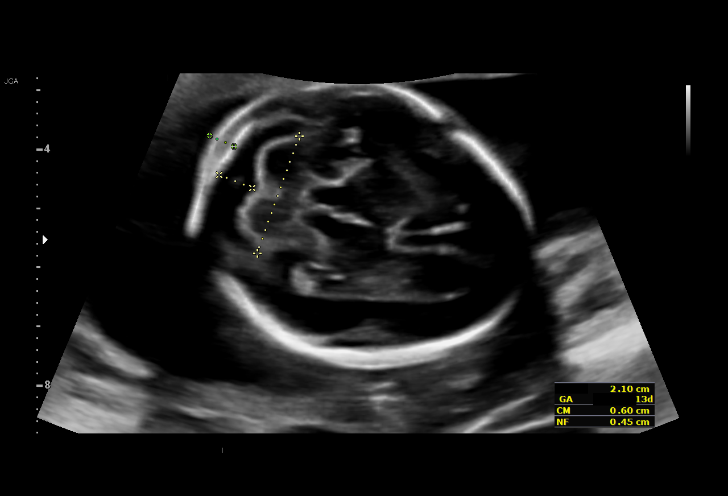
[im 45/68]
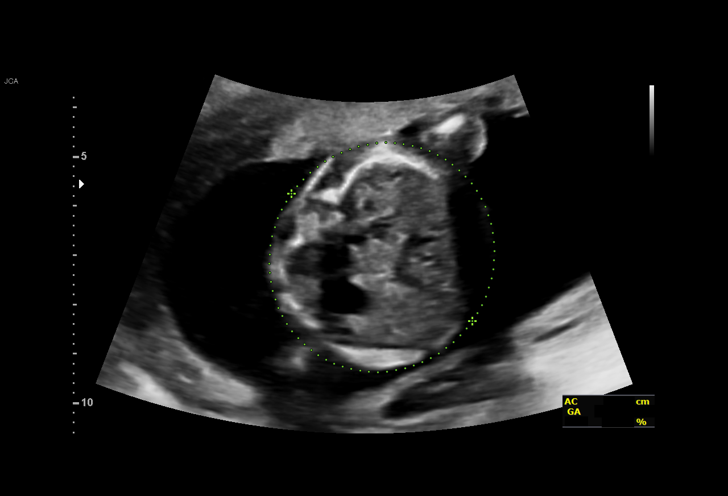
[im 50/68]
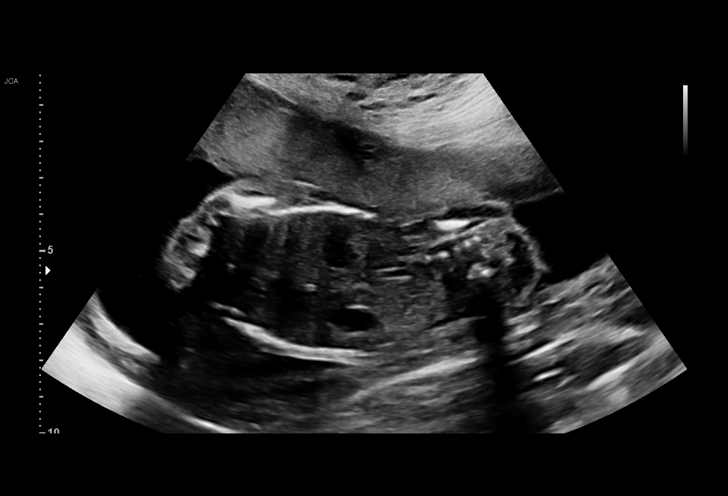
[im 55/68]
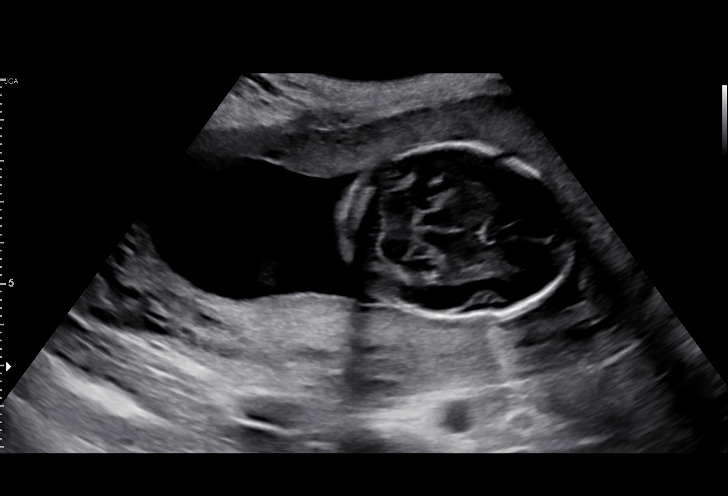
[im 60/68]
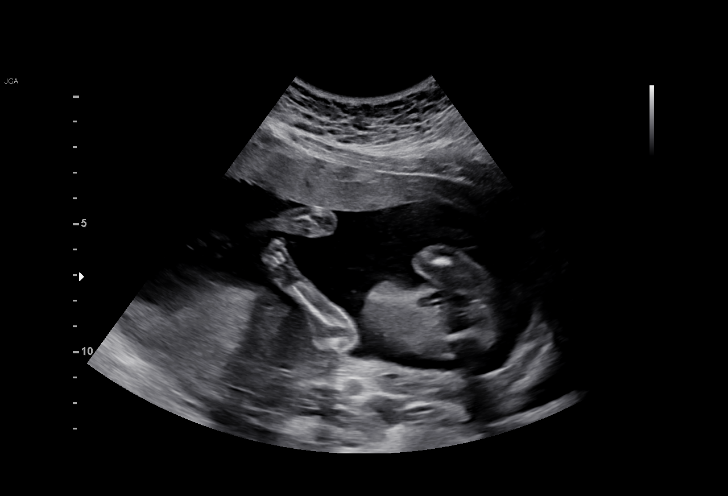
[im 65/68]
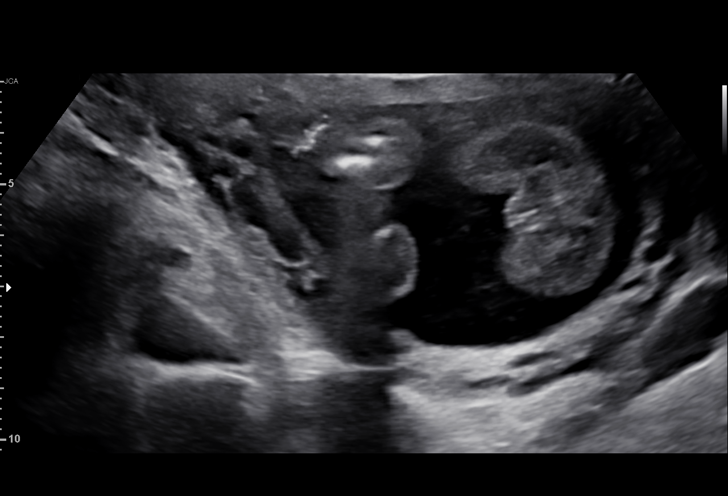

[13 of 28 positions shown; findings below may reference images not displayed]

[facility] [[facility]]
                   MATEKE CNM

 1  US MFM OB COMP + 14 WK                76805.01    QUIRIJN AMAZIGH

Indications

 Short interval between pregancies, 2nd
 trimester
 Encounter for antenatal screening for
 malformations
 19 weeks gestation of pregnancy
Fetal Evaluation

 Num Of Fetuses:         1
 Fetal Heart Rate(bpm):  158
 Cardiac Activity:       Observed
 Presentation:           Breech
 Placenta:               Anterior
 P. Cord Insertion:      Visualized

 Amniotic Fluid
 AFI FV:      Within normal limits

                             Largest Pocket(cm)

Biometry

 BPD:      45.1  mm     G. Age:  19w 4d         59  %    CI:        71.96   %    70 - 86
                                                         FL/HC:      16.9   %    16.1 -
 HC:      169.2  mm     G. Age:  19w 4d         48  %    HC/AC:      1.19        1.09 -
 AC:      142.3  mm     G. Age:  19w 4d         51  %    FL/BPD:     63.4   %
 FL:       28.6  mm     G. Age:  18w 5d         21  %    FL/AC:      20.1   %    20 - 24
 HUM:      29.9  mm     G. Age:  19w 6d         62  %
 CER:      20.9  mm     G. Age:  20w 0d         75  %
 NFT:       7.1  mm

 LV:        6.7  mm
 CM:        4.4  mm

 Est. FW:     282  gm    0 lb 10 oz      35  %
OB History

 Gravidity:    2         Term:   1
 Living:       1
Gestational Age

 U/S Today:     19w 3d                                        EDD:   04/13/20
 Best:          19w 3d     Det. By:  Previous Ultrasound      EDD:   04/13/20
                                     (09/07/19)
Anatomy

 Cranium:               Appears normal         Aortic Arch:            Not well visualized
 Cavum:                 Appears normal         Ductal Arch:            Not well visualized
 Ventricles:            Appears normal         Diaphragm:              Appears normal
 Choroid Plexus:        Appears normal         Stomach:                Appears normal, left
                                                                       sided
 Cerebellum:            Appears normal         Abdomen:                Appears normal
 Posterior Fossa:       Appears normal         Abdominal Wall:         Appears nml (cord
                                                                       insert, abd wall)
 Nuchal Fold:           Appears normal         Cord Vessels:           Appears normal (3
                                                                       vessel cord)
 Face:                  Orbits nl; profile not Kidneys:                Appear normal
                        well visualized
 Lips:                  Not well visualized    Bladder:                Appears normal
 Thoracic:              Appears normal         Spine:                  Limited views
                                                                       appear normal
 Heart:                 Not well visualized    Upper Extremities:      Appears normal
 RVOT:                  Not well visualized    Lower Extremities:      Appears normal
 LVOT:                  Not well visualized

 Other:  Fetus appears to be female. Technically difficult due to fetal position.
Cervix Uterus Adnexa

 Cervix
 Length:           5.29  cm.
 Normal appearance by transabdominal scan.

 Uterus
 No abnormality visualized.

 Right Ovary
 Not visualized.

 Left Ovary
 Visualized. Not visualized.

 Cul De Sac
 No free fluid seen.
 Adnexa
 No abnormality visualized. No adnexal mass
 visualized. No free fluid.
Comments

 This patient was seen for a detailed fetal anatomy scan.
 She denies any significant past medical history and denies
 any problems in her current pregnancy.
 The patient has not had any screening tests for fetal
 aneuploidy drawn in her current pregnancy.
 She was informed that the fetal growth and amniotic fluid
 level were appropriate for her gestational age.
 There were no obvious fetal anomalies noted on today's
 ultrasound exam.  However, the views of the fetal cardiac
 views were unable to be fully visualized due to the fetal
 position.
 The patient was informed that anomalies may be missed due
 to technical limitations. If the fetus is in a suboptimal position
 or maternal habitus is increased, visualization of the fetus in
 the maternal uterus may be impaired.
 A follow-up exam was scheduled in 4 weeks to complete the
 views of the fetal anatomy.

 As the patient has not had any screening tests for fetal
 aneuploidy drawn in her current pregnancy, she opted to
 have the Materni T21 cell free DNA test drawn following
 today's ultrasound exam.

## 2022-03-01 IMAGING — US US MFM OB FOLLOW-UP
1 series · 14 of 28 positions shown · non-contrast
Comparison: none

[Series 1: us mfm ob follow-up · 46 acquisitions, 14 frames shown]
[im 2/46]
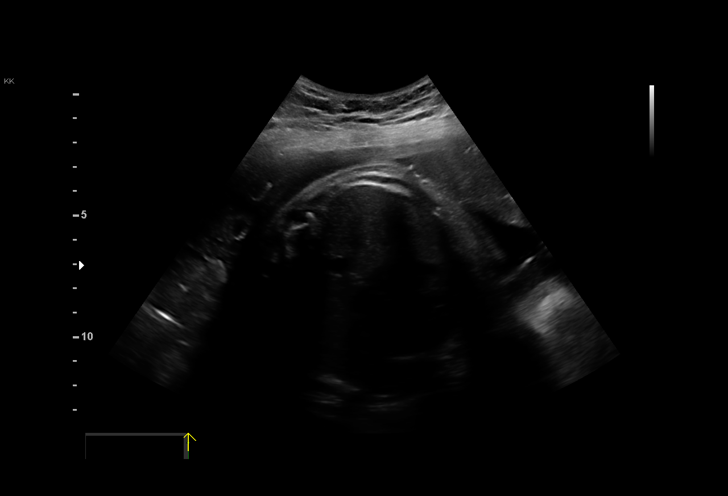
[im 6/46]
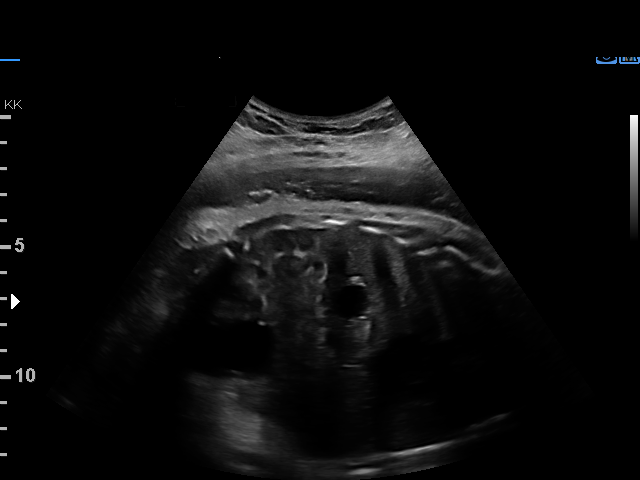
[im 9/46]
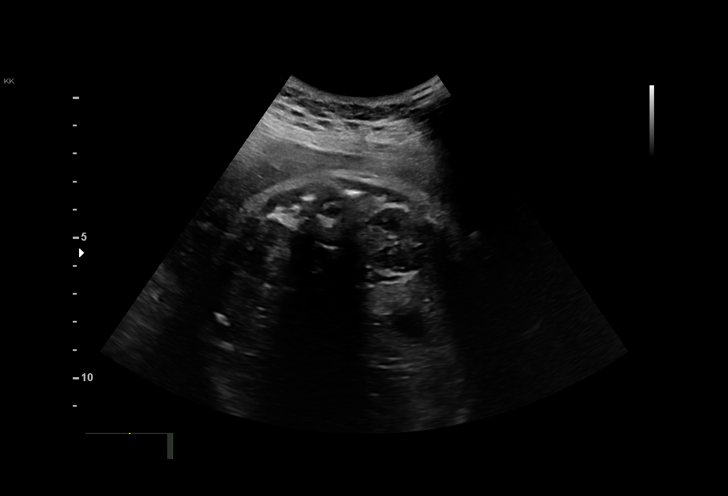
[im 12/46]
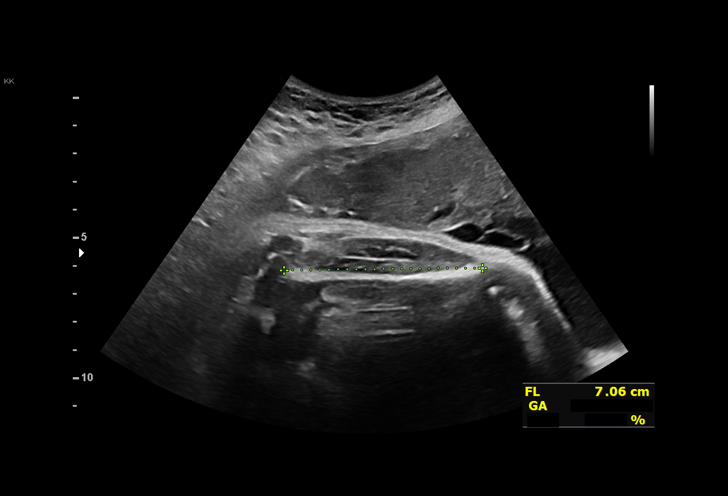
[im 16/46]
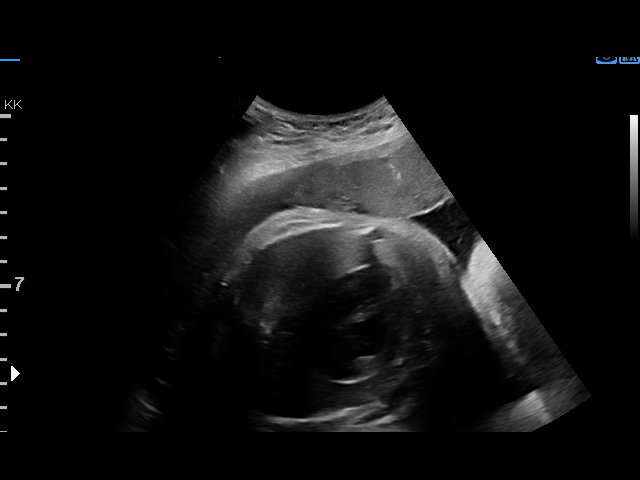
[im 19/46]
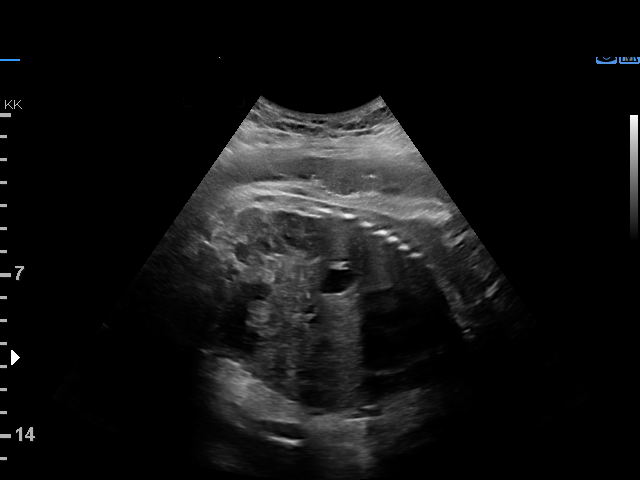
[im 22/46]
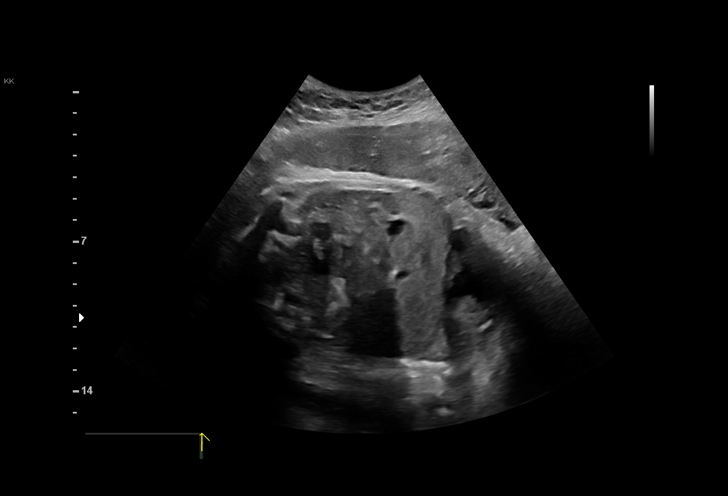
[im 26/46]
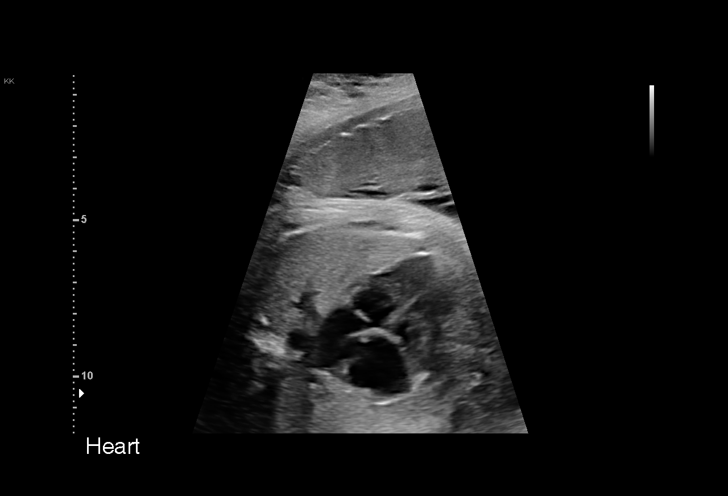
[im 29/46]
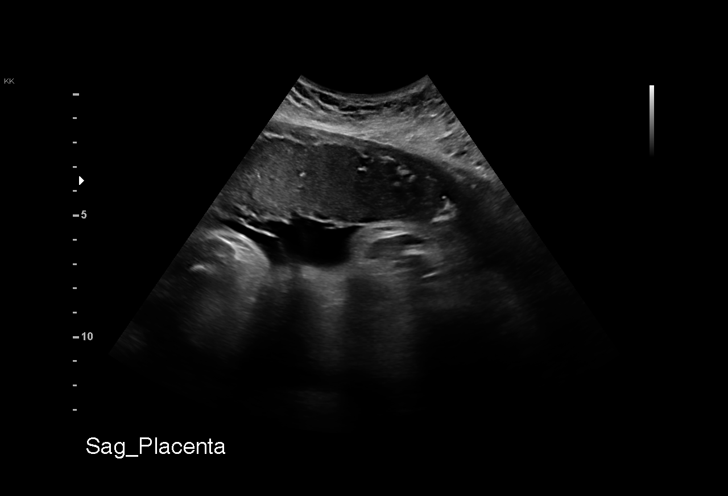
[im 32/46]
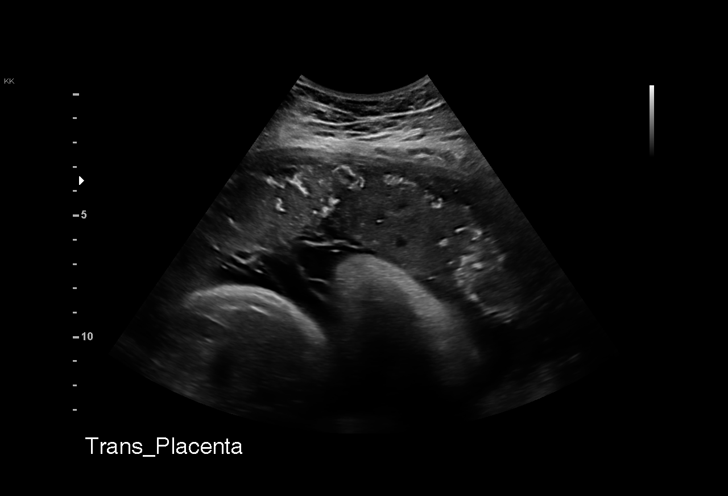
[im 36/46]
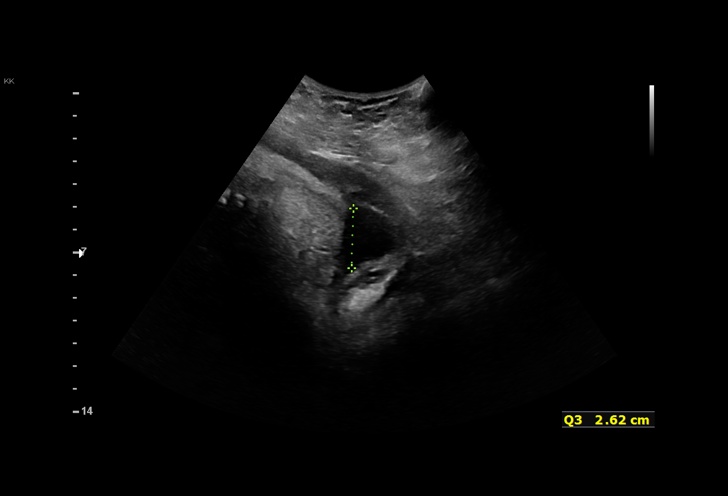
[im 39/46]
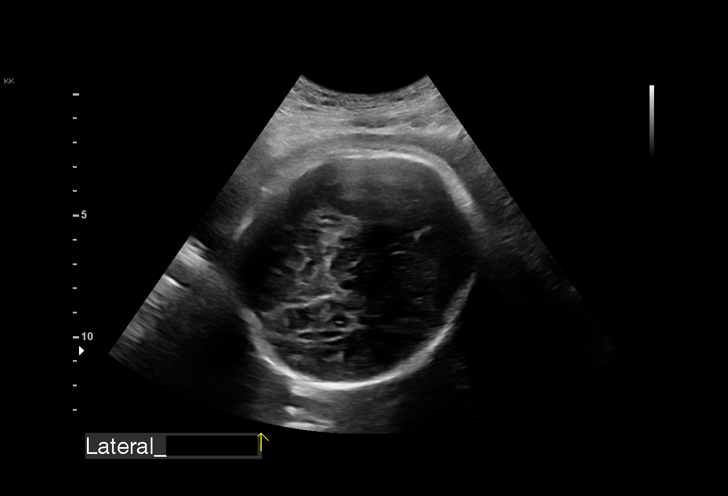
[im 42/46]
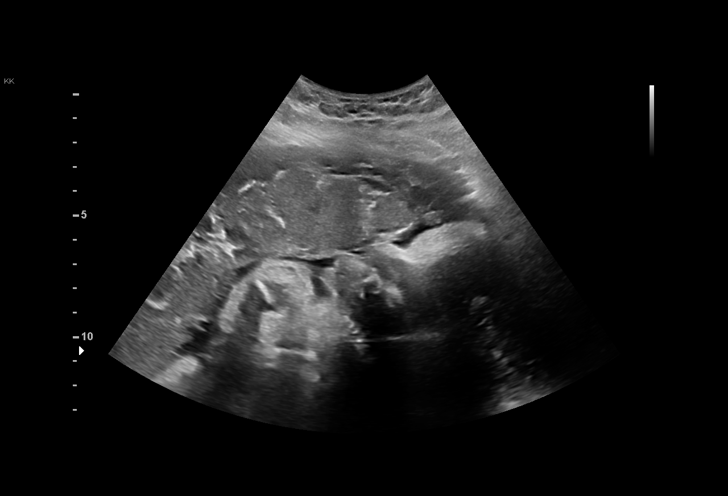
[im 46/46]
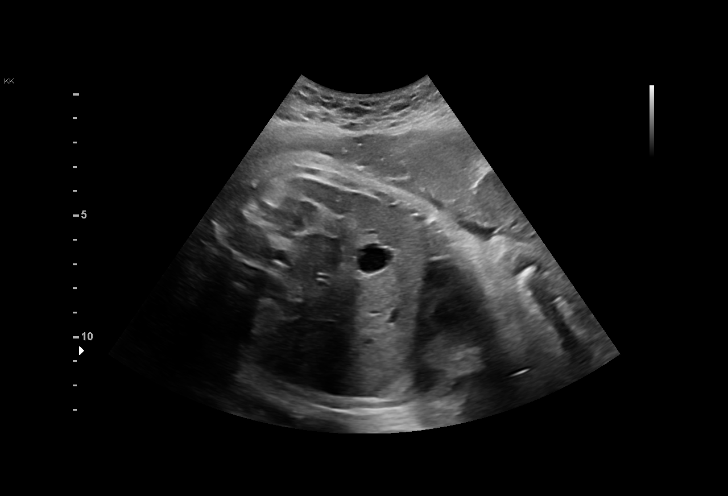

[14 of 28 positions shown; findings below may reference images not displayed]

[facility] [[facility]]
                   OEMIN CNM

Indications

 Short interval between pregancies, 3rd
 trimester
 Encounter for other antenatal screening
 follow-up
 Genetic carrier (SMA carrier)
 Negative AFP(Low Risk NIPS)
 36 weeks gestation of pregnancy
 Gestational diabetes in pregnancy, diet
 controlled
Fetal Evaluation

 Num Of Fetuses:         1
 Fetal Heart Rate(bpm):  148
 Cardiac Activity:       Observed
 Presentation:           Cephalic
 Placenta:               Anterior
 P. Cord Insertion:      Previously Visualized

 Amniotic Fluid
 AFI FV:      Within normal limits

 AFI Sum(cm)     %Tile       Largest Pocket(cm)
 12.04           38

 RUQ(cm)                     LUQ(cm)        LLQ(cm)

Biometry

 BPD:      91.3  mm     G. Age:  37w 0d         75  %    CI:         82.3   %    70 - 86
                                                         FL/HC:      22.6   %    20.8 -
 HC:      317.5  mm     G. Age:  35w 5d          9  %    HC/AC:      0.94        0.92 -
 AC:      336.6  mm     G. Age:  37w 4d         86  %    FL/BPD:     78.4   %    71 - 87
 FL:       71.6  mm     G. Age:  36w 5d         50  %    FL/AC:      21.3   %    20 - 24

 Est. FW:    7113  gm    6 lb 14 oz      68  %
OB History

 Gravidity:    2         Term:   1
 Living:       1
Gestational Age

 U/S Today:     36w 5d                                        EDD:   04/12/20
 Best:          36w 4d     Det. By:  Previous Ultrasound      EDD:   04/13/20
                                     (09/07/19)
Anatomy

 Cranium:               Appears normal         Aortic Arch:            Previously seen
 Cavum:                 Previously seen        Ductal Arch:            Previously seen
 Ventricles:            Appears normal         Diaphragm:              Appears normal
 Choroid Plexus:        Previously seen        Stomach:                Appears normal, left
                                                                       sided
 Cerebellum:            Previously seen        Abdomen:                Appears normal
 Posterior Fossa:       Previously seen        Abdominal Wall:         Previously seen
 Nuchal Fold:           Previously seen        Cord Vessels:           Previously seen
 Face:                  Orbits and profile     Kidneys:                Appear normal
                        previously seen
 Lips:                  Previously seen        Bladder:                Appears normal
 Thoracic:              Appears normal         Spine:                  Previously seen
 Heart:                 Appears normal         Upper Extremities:      Previously seen
                        (4CH, axis, and
                        situs)
 RVOT:                  Previously seen        Lower Extremities:      Previously seen
 LVOT:                  Previously seen

 Other:  Fetus appears to be female. Technically difficult due to fetal position.
Cervix Uterus Adnexa

 Cervix
 Not visualized (advanced GA >89wks)
Impression

 Gestational diabetes. Well-controlled on diet .

 Amniotic fluid is normal and good fetal activity is seen .Fetal
 growth is appropriate for gestational age .

 Cephalic presentation.
Recommendations

 Follow-up scans as clinically indicated.
                 [place], Rajeev T J

## 2022-05-27 ENCOUNTER — Ambulatory Visit
Admission: EM | Admit: 2022-05-27 | Discharge: 2022-05-27 | Disposition: A | Discharge status: Home / Self Care | Payer: Medicaid Other | Attending: Physician Assistant | Admitting: Physician Assistant

## 2022-05-27 DIAGNOSIS — Z113 Encounter for screening for infections with a predominantly sexual mode of transmission: Secondary | ICD-10-CM | POA: Insufficient documentation

## 2022-05-27 DIAGNOSIS — J029 Acute pharyngitis, unspecified: Secondary | ICD-10-CM | POA: Diagnosis not present

## 2022-05-27 LAB — POCT RAPID STREP A (OFFICE): Rapid Strep A Screen: NEGATIVE

## 2022-05-27 MED ORDER — AMOXICILLIN 500 MG PO CAPS: 500.0000 mg | ORAL_CAPSULE | Freq: Three times a day (TID) | ORAL | 0 refills | Status: DC

## 2022-05-27 NOTE — ED Provider Notes (Signed)
[facility]    CSN: [id number] Arrival date & time: 05/27/22  2440      History   Chief Complaint Chief Complaint  Patient presents with   Sore Throat   Headache    HPI Christine Bailey is a 27 y.o. female.   Patient here today for evaluation of sore throat she has had the last 2 days.  She denies any fever.  She has had mild cough and congestion.  She denies any vomiting but has had some nausea with headaches.  She has tried over-the-counter medication without resolution.  The history is provided by the patient.  Sore Throat Associated symptoms include headaches. Pertinent negatives include no abdominal pain and no shortness of breath.  Headache Associated symptoms: congestion, cough, nausea and sore throat   Associated symptoms: no abdominal pain, no diarrhea, no ear pain, no fever and no vomiting     Past Medical History:  Diagnosis Date   Chlamydia    Eczema    Supervision of high risk pregnancy, antepartum 10/10/2019    Nursing Staff Provider Office Location  [facility] Dating    1st trimester [place] Language   English Anatomy US   normal Flu Vaccine  Declined  Genetic Screen  NIPS:  Low risk female  AFP:  Screen negative   TDaP vaccine   03/05/20 Hgb A1C or  GTT Early  Third trimester:    Ref. 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 Authorizing Provider  amoxicillin (AMOXIL) 500 MG capsule Take 1 capsule (500 mg total) by mouth 3 (three) times daily. 05/27/22  Yes Francene Finders, PA-C  benzonatate (TESSALON) 100 MG capsule Take 1 capsule (100 mg total) by mouth every 8 (eight) hours. 11/17/20   Raspet, Derry Skill, PA-C  Blood Pressure Monitoring (BLOOD PRESSURE KIT) DEVI 1 Device by Does not apply route as needed. ICD 10:Z34.00 12/06/18   Starr Lake, CNM  ibuprofen (ADVIL) 600 MG tablet Take 1 tablet (600 mg total) by mouth every 6 (six) hours. 04/11/20   Seabron Spates, CNM  levonorgestrel-ethinyl estradiol (ALESSE) 0.1-20 MG-MCG tablet Take 1 tablet by mouth daily. 05/29/20   Tresea Mall, CNM  ondansetron (ZOFRAN ODT) 4 MG disintegrating tablet Take 1 tablet (4 mg total) by mouth every 8 (eight) hours as needed for nausea or vomiting. 11/17/20   Raspet, Derry Skill,  PA-C  amLODipine (NORVASC) 5 MG tablet TAKE 1 TABLET (5 MG TOTAL) BY MOUTH DAILY. Patient not taking: No sig reported 04/11/20 07/25/20  Seabron Spates, CNM    Family History Family History  Adopted: Yes  Problem Relation Age of Onset   Cancer Mother        Breast    Social History Social History   Tobacco Use   Smoking status: Every Day    Types: Cigarettes   Smokeless tobacco: Never   Tobacco comments:    3 cigarettes / day  Vaping Use   Vaping Use: Never used  Substance Use Topics   Alcohol use: Yes   Drug use: Yes    Types: Marijuana     Allergies   Patient has no known allergies.   Review of Systems Review of Systems  Constitutional:  Negative for chills and fever.  HENT:  Positive for congestion and sore throat. Negative for ear pain.   Eyes:  Negative for discharge and redness.  Respiratory:  Positive for cough. Negative for shortness of breath and  wheezing.   Gastrointestinal:  Positive for nausea. Negative for abdominal pain, diarrhea and vomiting.  Neurological:  Positive for headaches.     Physical Exam Triage Vital Signs ED Triage Vitals  Enc Vitals Group     BP 05/27/22 1154 106/69     Pulse Rate 05/27/22 1154 91     Resp 05/27/22 1154 18     Temp 05/27/22 1154 98.2 F (36.8 C)     Temp Source 05/27/22 1154 Oral     SpO2 05/27/22 1154 97 %     Weight --      Height --      Head Circumference --      Peak Flow --      Pain Score 05/27/22 1155 6     Pain Loc --      Pain Edu? --      Excl. in [place]? --    No data found.  Updated Vital Signs BP 106/69 (BP Location: Left Arm)   Pulse 91   Temp 98.2 F (36.8 C) (Oral)   Resp 18   LMP 05/21/2022   SpO2 97%     Physical Exam Vitals and nursing note reviewed.  Constitutional:      General: She is not in acute distress.    Appearance: Normal appearance. She is not ill-appearing.  HENT:     Head: Normocephalic and atraumatic.     Nose: Congestion present.     Mouth/Throat:     Mouth: Mucous membranes are moist.     Pharynx: Oropharyngeal exudate and posterior oropharyngeal erythema present.  Eyes:     Conjunctiva/sclera: Conjunctivae normal.  Cardiovascular:     Rate and Rhythm: Normal rate and regular rhythm.     Heart sounds: Normal heart sounds. No murmur heard. Pulmonary:     Effort: Pulmonary effort is normal. No respiratory distress.     Breath sounds: Normal breath sounds. No wheezing, rhonchi or rales.  Skin:    General: Skin is warm and dry.  Neurological:     Mental Status: She is alert.  Psychiatric:        Mood and Affect: Mood normal.        Thought Content: Thought content normal.      UC Treatments / Results  Labs (all labs ordered are listed, but only abnormal results are displayed) Labs Reviewed  POCT RAPID STREP A (OFFICE)  CERVICOVAGINAL  ANCILLARY ONLY    EKG   Radiology No results found.  Procedures Procedures  (including critical care time)  Medications Ordered in UC Medications - No data to display  Initial Impression / Assessment and Plan / UC Course  I have reviewed the triage vital signs and the nursing notes.  Pertinent labs & imaging results that were available during my care of the patient were reviewed by me and considered in my medical decision making (see chart for details).    Rapid strep negative however given appearance of tonsils will treat with amoxicillin. Encouraged follow up if no gradual improvement or with any further concerns.   Final Clinical Impressions(s) / UC Diagnoses   Final diagnoses:  Acute pharyngitis, unspecified etiology  Screening for STD (sexually transmitted disease)   Discharge Instructions   None    ED Prescriptions     Medication Sig Dispense Auth. Provider   amoxicillin (AMOXIL) 500 MG capsule Take 1 capsule (500 mg total) by mouth 3 (three) times daily. 21 capsule Francene Finders, PA-C      PDMP not reviewed this encounter.   Francene Finders, PA-C 05/27/22 [phone]

## 2022-05-27 NOTE — ED Triage Notes (Signed)
Pt presents with ongoing sore throat X 2 days.  Pt also complains of recurrent headache with sensitivity to light and nausea.

## 2022-05-28 LAB — CERVICOVAGINAL ANCILLARY ONLY
Bacterial Vaginitis (gardnerella): NEGATIVE
Candida Glabrata: NEGATIVE
Candida Vaginitis: NEGATIVE
Chlamydia: NEGATIVE
Comment: NEGATIVE
Comment: NEGATIVE
Comment: NEGATIVE
Comment: NEGATIVE
Comment: NEGATIVE
Comment: NORMAL
Neisseria Gonorrhea: NEGATIVE
Trichomonas: NEGATIVE

## 2022-07-08 NOTE — Progress Notes (Signed)
Subjective:    Christine Bailey - 27 y.o. female MRN [id number]  Date of birth: 1995/10/19  HPI  Christine Bailey is to establish care.   Current issues and/or concerns: - Intermittent chronic migraines since 2021. States this began after the birth of her child in 2021. Migraines occurs 4 times weekly usually during bedtime. Symptoms include nausea, sensitivity to light, and fatigue. She denies red flag symptoms. States she prefers not to not take medications to help.  - Intermittent syncope since 2021. States this began after the birth of her child in 2021. The last time occurred was 1 month ago. She recalls during that time she was cooking food for her children and suddenly feeling a "heat wave" and then passing out. States when she came to she felt normal. She is unsure of how long she was passed out. Reports she did not hit her head. Reports she did not seek medical evaluation at the emergency department. Reports in the past when this happened while she was pregnant she went to the emergency department for evaluation and felt they "brushed her off" because she appeared normal.  - Concern for memory loss since 2021. Reports both short-term and long-term memory loss. No additional symptoms. - Reports she is adopted and unsure of all of her family medical history.  - No further issues/concerns for discussion today.   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 She has been exposed to tobacco smoke. She has never used  smokeless tobacco. She reports current alcohol use. She reports current drug use. Drug: Marijuana.   Family History  family history includes Cancer in her mother. She was adopted.   Medications: reviewed and updated   Objective:   Physical Exam BP 114/78 (BP Location: Left Arm, Patient Position: Sitting, Cuff Size: Large)   Pulse 87   Temp 98.5 F (36.9 C)   Resp 16   Ht 5' 7.68" (1.719 m)   Wt 212 lb (96.2 kg)   SpO2 98%   BMI 32.54 kg/m   Physical Exam HENT:     Head: Normocephalic and atraumatic.     Mouth/Throat:     Mouth: Mucous membranes are moist.     Pharynx: Oropharynx is clear.  Eyes:     Extraocular Movements: Extraocular movements intact.     Conjunctiva/sclera: Conjunctivae normal.     Pupils: Pupils are equal, round, and reactive to light.  Cardiovascular:     Rate and Rhythm: Normal rate and regular rhythm.     Pulses: Normal pulses.     Heart sounds: Normal heart sounds.  Pulmonary:     Effort: Pulmonary effort is normal.     Breath sounds: Normal breath sounds.  Musculoskeletal:     Cervical back: Normal range of motion and neck supple.  Neurological:     General: No focal deficit present.     Mental Status: She is alert and oriented to person, place, and time.  Psychiatric:        Mood and  Affect: Mood normal.        Behavior: Behavior normal.       Assessment & Plan:  1. Encounter to establish care - Patient presents today to establish care. During the interim follow-up with primary provider as scheduled.  - Return for annual physical examination, labs, and health maintenance. Arrive fasting meaning having no food for at least 8 hours prior to appointment. You may have only water or black coffee. Please take scheduled medications as normal.  2. Syncope, unspecified syncope type - Referral to Cardiology for further evaluation/management.  - Ambulatory referral to Cardiology  3. Memory loss 4. Brain fog 5. Chronic migraine w/o aura w/o  status migrainosus, not intractable - Patient declined pharmacological therapy.  - Referral to Neurology for further evaluation/management.  - Ambulatory referral to Neurology    Patient was given clear instructions to go to Emergency Department or return to medical center if symptoms don't improve, worsen, or new problems develop.The patient verbalized understanding.  I discussed the assessment and treatment plan with the patient. The patient was provided an opportunity to ask questions and all were answered. The patient agreed with the plan and demonstrated an understanding of the instructions.   The patient was advised to call back or seek an in-person evaluation if the symptoms worsen or if the condition fails to improve as anticipated.    Durene Fruits, NP 07/13/2022, 10:12 AM Primary Care at [facility]

## 2022-07-13 ENCOUNTER — Encounter: Payer: Self-pay | Admitting: Family

## 2022-07-13 ENCOUNTER — Ambulatory Visit (INDEPENDENT_AMBULATORY_CARE_PROVIDER_SITE_OTHER): Payer: Medicaid Other | Admitting: Family

## 2022-07-13 VITALS — BP 114/78 | HR 87 | Temp 98.5°F | Resp 16 | Ht 67.68 in | Wt 212.0 lb

## 2022-07-13 DIAGNOSIS — R4189 Other symptoms and signs involving cognitive functions and awareness: Secondary | ICD-10-CM

## 2022-07-13 DIAGNOSIS — G43709 Chronic migraine without aura, not intractable, without status migrainosus: Secondary | ICD-10-CM

## 2022-07-13 DIAGNOSIS — R413 Other amnesia: Secondary | ICD-10-CM | POA: Diagnosis not present

## 2022-07-13 DIAGNOSIS — R55 Syncope and collapse: Secondary | ICD-10-CM

## 2022-07-13 DIAGNOSIS — Z7689 Persons encountering health services in other specified circumstances: Secondary | ICD-10-CM | POA: Diagnosis not present

## 2022-07-13 NOTE — Progress Notes (Signed)
.  Pt presents to establish care,  -experiences migraines about 4x a week usually qhs  -symptoms include nausea, sensitivity to light, and fatigue  -pt experienced bouts of syncope approx 1 month ago

## 2022-07-13 NOTE — Patient Instructions (Signed)
Thank you for choosing Primary Care at [facility] for your medical home!    Christine Bailey was seen by Christine Herter, NP today.   Christine Bailey's primary care provider is Christine Fruits, NP.   For the best care possible,  you should try to see Christine Fruits, NP whenever you come to office.   We look forward to seeing you again soon!  If you have any questions about your visit today,  please call us at [phone]  Or feel free to reach your provider via Daisy.    Keeping you healthy   Get these tests Blood pressure- Have your blood pressure checked once a year by your healthcare provider.  Normal blood pressure is 120/80. Weight- Have your body mass index (BMI) calculated to screen for obesity.  BMI is a measure of body fat based on height and weight. You can also calculate your own BMI at GravelBags.it. Cholesterol- Have your cholesterol checked regularly starting at age 80, sooner may be necessary if you have diabetes, high blood pressure, if a family member developed heart diseases at an early age or if you smoke.  Chlamydia, HIV, and other sexual transmitted disease- Get screened each year until the age of 64 then within three months of each new sexual partner. Diabetes- Have your blood sugar checked regularly if you have high blood pressure, high cholesterol, a family history of diabetes or if you are overweight.   Get these vaccines Flu shot- Every fall. Tetanus shot- Every 10 years. Menactra- Single dose; prevents meningitis.   Take these steps Don't smoke- If you do smoke, ask your healthcare provider about quitting. For tips on how to quit, go to www.smokefree.gov or call 1-800-QUIT-NOW. Be physically active- Exercise 5 days a week for at least 30 minutes.  If you are not already physically active start slow and gradually work up to 30 minutes of moderate physical activity.  Examples of moderate activity include walking briskly, mowing the yard, dancing, swimming  bicycling, etc. Eat a healthy diet- Eat a variety of healthy foods such as Bailey, vegetables, low fat milk, low fat cheese, yogurt, lean meats, poultry, fish, beans, tofu, etc.  For more information on healthy eating, go to www.thenutritionsource.org Drink alcohol in moderation- Limit alcohol intake two drinks or less a day.  Never drink and drive. Dentist- Brush and floss teeth twice daily; visit your dentis twice a year. Depression-Your emotional health is as important as your physical health.  If you're feeling down, losing interest in things you normally enjoy please talk with your healthcare provider. Gun Safety- If you keep a gun in your home, keep it unloaded and with the safety lock on.  Bullets should be stored separately. Helmet use- Always wear a helmet when riding a motorcycle, bicycle, rollerblading or skateboarding. Safe sex- If you may be exposed to a sexually transmitted infection, use a condom Seat belts- Seat bels can save your life; always wear one. Smoke/Carbon Monoxide detectors- These detectors need to be installed on the appropriate level of your home.  Replace batteries at least once a year. Skin Cancer- When out in the sun, cover up and use sunscreen SPF 15 or higher. Violence- If anyone is threatening or hurting you, please tell your healthcare provider.

## 2022-08-13 DIAGNOSIS — Z3041 Encounter for surveillance of contraceptive pills: Secondary | ICD-10-CM | POA: Diagnosis not present

## 2022-08-14 ENCOUNTER — Encounter: Payer: Self-pay | Admitting: Internal Medicine

## 2022-08-14 ENCOUNTER — Ambulatory Visit: Payer: Medicaid Other | Attending: Internal Medicine | Admitting: Internal Medicine

## 2022-08-14 VITALS — BP 132/86 | HR 83 | Ht 67.0 in | Wt 213.2 lb

## 2022-08-14 DIAGNOSIS — R55 Syncope and collapse: Secondary | ICD-10-CM

## 2022-08-14 DIAGNOSIS — R9431 Abnormal electrocardiogram [ECG] [EKG]: Secondary | ICD-10-CM | POA: Diagnosis not present

## 2022-08-14 NOTE — Progress Notes (Signed)
Cardiology Office Note:    Date:  08/14/2022   ID:  Christine Bailey, DOB 04/06/1996, MRN [id number]  PCP:  Patient, No Pcp Per  Cardiologist:  Parke Poisson, MD  Electrophysiologist:  None   Referring MD: Rema Fendt, NP   Chief Complaint/Reason for Referral: Syncope  History of Present Illness:    Christine Bailey is a 27 y.o. female with a history of high-risk pregnancy (2021), and chlamydia, here for the evaluation of syncope.  She established care with her PCP 07/13/2022. She reported intermittent chronic migraines since 2021 following the birth of her child, occurring about 4x a week. She also noted intermittent syncopal episodes since that time as well. Her most recent episode had been 1 month prior in the setting of cooking food for her children and feeling a sudden "heat wave" prior to losing consciousness. She did not seek medical evaluation. Also complained of short-term and long-term memory loss since 2021. At that visit she declined pharmacological therapy. She was referred to cardiology and neurology.  She is adopted and unsure of her complete family history.  Today, she confirms having syncopal episodes a few times in the past couple years. Her initial episode was in 2018, and her second was while she was pregnant with her first daughter. Her most recent episode was a few months ago while preparing a meal. The next thing she knew she was on the ground. Prior to that event, her last syncopal episode was 1-2 years prior in the setting of getting her hair braided. Generally her syncopal episodes have been random and associated with a prodrome of lightheadedness and some dizziness. Her symptoms have a very fast onset. Typically she tries to drink enough water throughout the day.  She complains of occasional headaches that last a long time. She goes to bed, doesn't take medication. Additionally she has occasional pressure in calves and lower back.  Not much activity as a child, no  hospitalizations but did have ear surgery. She has 2 daughters, age 8 and 2. She is adopted. She only knows that her biological mother had breast cancer. Not sure of any complications while her mother was pregnant.   Approximately 5 year intermittent smoking history. She started smoking in 2018, more heavily after her pregnancies. Has been able to quit for long periods in the past.  She denies any palpitations, chest pain, shortness of breath, or peripheral edema. No orthopnea, or PND.   Past Medical History:  Diagnosis Date   Chlamydia    Eczema    Supervision of high risk pregnancy, antepartum 10/10/2019    Nursing Staff Provider Office Location  [facility] Dating    1st trimester [place] Language   English Anatomy US   normal Flu Vaccine  Declined  Genetic Screen  NIPS:  Low risk female  AFP:  Screen negative   TDaP vaccine   03/05/20 Hgb A1C or  GTT Early  Third trimester:    Ref. Range 01/23/2020 11:16 Glucose, 1 hour Latest Ref Range: 65 - 179 mg/dL 409 Glucose, Fasting Latest Ref Range: 65 - 91 mg/dL 92    Vaginal delivery 04/10/2020    Past Surgical History:  Procedure Laterality Date   NO PAST SURGERIES      Current Medications: Current Meds  Medication Sig   norgestimate-ethinyl estradiol (ORTHO-CYCLEN) 0.25-35 MG-MCG tablet Take 1 tablet by mouth daily.     Allergies:   Patient has no known allergies.   Social History   Tobacco Use  Smoking status: Every Day    Types: Cigarettes    Passive exposure: Current   Smokeless tobacco: Never   Tobacco comments:    3 cigarettes / day  Vaping Use   Vaping Use: Never used  Substance Use Topics   Alcohol use: Yes   Drug use: Yes    Types: Marijuana     Family History: The patient's family history includes Cancer in her mother. She was adopted.  ROS:   Please see the history of present illness.    All other systems reviewed and are negative.  EKGs/Labs/Other Studies Reviewed:    The following studies were reviewed  today:  No prior cardiovascular studies available.  EKG:  EKG is personally reviewed. 08/14/2022:  NSR. Nonspecific T wave abnormality.  Imaging studies that I have independently reviewed today: n/a  Recent Labs: No results found for requested labs within last 365 days.   Recent Lipid Panel No results found for: "CHOL", "TRIG", "HDL", "CHOLHDL", "VLDL", "LDLCALC", "LDLDIRECT"  Physical Exam:    VS:  BP 132/86   Pulse 83   Ht 5\' 7"  (1.702 m)   Wt 213 lb 3.2 oz (96.7 kg)   LMP 07/31/2022   SpO2 97%   BMI 33.39 kg/m     Wt Readings from Last 5 Encounters:  08/14/22 213 lb 3.2 oz (96.7 kg)  07/13/22 212 lb (96.2 kg)  10/27/21 195 lb (88.5 kg)  07/25/20 204 lb (92.5 kg)  05/29/20 203 lb 11.2 oz (92.4 kg)    Constitutional: No acute distress Eyes: sclera non-icteric, normal conjunctiva and lids ENMT: normal dentition, moist mucous membranes Cardiovascular: regular rhythm, normal rate, faint systolic likely flow murmur. S1 and S2 normal. No jugular venous distention.  Respiratory: clear to auscultation bilaterally GI : normal bowel sounds, soft and nontender. No distention.   MSK: extremities warm, well perfused. No edema.  NEURO: grossly nonfocal exam, moves all extremities. PSYCH: alert and oriented x 3, normal mood and affect.   ASSESSMENT:    1. Syncope, unspecified syncope type   2. Abnormal EKG    PLAN:    Syncope, unspecified syncope type - Plan: EKG 12-Lead, CBC, Basic metabolic panel, TSH, ECHOCARDIOGRAM COMPLETE  Abnormal EKG - Plan: EKG 12-Lead, CBC, Basic metabolic panel, TSH, ECHOCARDIOGRAM COMPLETE  Plan: -Patient has been anemic at the end of her pregnancy in 2021, without repeat lab work since.  Recommend we obtain basic laboratory studies today in the evaluation of some of her symptoms, will obtain CBC, BMP, TSH with regard to syncope.  Syncope sounds neurocardiogenic in origin, however if echocardiogram unrevealing could consider cardiac monitor.  No  associated palpitations. -Encouraged smoking cessation. -Will obtain an echocardiogram to evaluate for structural or undiagnosed congenital heart disease.  Follow-up in 3 months.  Weston Brass, MD, [facility]  [facility]   Shared Decision Making/Informed Consent:       Medication Adjustments/Labs and Tests Ordered: Current medicines are reviewed at length with the patient today.  Concerns regarding medicines are outlined above.   Orders Placed This Encounter  Procedures   CBC   Basic metabolic panel   TSH   EKG 12-Lead   ECHOCARDIOGRAM COMPLETE   No orders of the defined types were placed in this encounter.  Patient Instructions  Medication Instructions:  No Changes In Medications at this time.  *If you need a refill on your cardiac medications before your next appointment, please call your pharmacy*  Lab Work: BLOOD WORK TODAY  If you  have labs (blood work) drawn today and your tests are completely normal, you will receive your results only by: MyChart Message (if you have MyChart) OR A paper copy in the mail If you have any lab test that is abnormal or we need to change your treatment, we will call you to review the results.  Testing/Procedures: Your physician has requested that you have an echocardiogram. Echocardiography is a painless test that uses sound waves to create images of your heart. It provides your doctor with information about the size and shape of your heart and how well your heart's chambers and valves are working. You may receive an ultrasound enhancing agent through an IV if needed to better visualize your heart during the echo.This procedure takes approximately one hour. There are no restrictions for this procedure. This will take place at the [address].   Follow-Up: At [facility], you and your health needs are our priority.  As part of our continuing mission to provide you with exceptional heart care, we have  created designated Provider Care Teams.  These Care Teams include your primary Cardiologist (physician) and Advanced Practice Providers (APPs -  Physician Assistants and Nurse Practitioners) who all work together to provide you with the care you need, when you need it.  Your next appointment:   3 month(s)  Provider:   Parke Poisson, MD   OR APP    I,Mathew Stumpf,acting as a scribe for Parke Poisson, MD.,have documented all relevant documentation on the behalf of Parke Poisson, MD,as directed by  Parke Poisson, MD while in the presence of Parke Poisson, MD.  I, Parke Poisson, MD, have reviewed all documentation for the visit on 08/14/2022. The documentation on today's date of service for the exam, diagnosis, procedures, and orders are all accurate and complete.

## 2022-08-14 NOTE — Patient Instructions (Signed)
Medication Instructions:  No Changes In Medications at this time.  *If you need a refill on your cardiac medications before your next appointment, please call your pharmacy*  Lab Work: BLOOD WORK TODAY  If you have labs (blood work) drawn today and your tests are completely normal, you will receive your results only by: MyChart Message (if you have MyChart) OR A paper copy in the mail If you have any lab test that is abnormal or we need to change your treatment, we will call you to review the results.  Testing/Procedures: Your physician has requested that you have an echocardiogram. Echocardiography is a painless test that uses sound waves to create images of your heart. It provides your doctor with information about the size and shape of your heart and how well your heart's chambers and valves are working. You may receive an ultrasound enhancing agent through an IV if needed to better visualize your heart during the echo.This procedure takes approximately one hour. There are no restrictions for this procedure. This will take place at the [address], [address].   Follow-Up: At [facility], you and your health needs are our priority.  As part of our continuing mission to provide you with exceptional heart care, we have created designated Provider Care Teams.  These Care Teams include your primary Cardiologist (physician) and Advanced Practice Providers (APPs -  Physician Assistants and Nurse Practitioners) who all work together to provide you with the care you need, when you need it.  Your next appointment:   3 month(s)  Provider:   Parke Poisson, MD   OR APP

## 2022-08-15 LAB — TSH: TSH: 0.669 u[IU]/mL (ref 0.450–4.500)

## 2022-08-15 LAB — CBC
Hematocrit: 41.5 % (ref 34.0–46.6)
Hemoglobin: 14.1 g/dL (ref 11.1–15.9)
MCH: 31.6 pg (ref 26.6–33.0)
MCHC: 34 g/dL (ref 31.5–35.7)
MCV: 93 fL (ref 79–97)
Platelets: 280 10*3/uL (ref 150–450)
RBC: 4.46 x10E6/uL (ref 3.77–5.28)
RDW: 11.8 % (ref 11.7–15.4)
WBC: 5.6 10*3/uL (ref 3.4–10.8)

## 2022-08-15 LAB — BASIC METABOLIC PANEL
BUN/Creatinine Ratio: 15 (ref 9–23)
BUN: 10 mg/dL (ref 6–20)
CO2: 19 mmol/L — ABNORMAL LOW (ref 20–29)
Calcium: 9.7 mg/dL (ref 8.7–10.2)
Chloride: 105 mmol/L (ref 96–106)
Creatinine, Ser: 0.65 mg/dL (ref 0.57–1.00)
Glucose: 92 mg/dL (ref 70–99)
Potassium: 4.2 mmol/L (ref 3.5–5.2)
Sodium: 139 mmol/L (ref 134–144)
eGFR: 126 mL/min/{1.73_m2} (ref >59)

## 2022-09-09 NOTE — Progress Notes (Deleted)
Patient ID: Christine Bailey, female    DOB: 1995/10/02  MRN: [id number]  CC: Annual Physical Exam  Subjective: Christine Bailey is a 27 y.o. female who presents for annual physical exam.  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     [DISCONTINUED] amLODipine (NORVASC) 5 MG tablet TAKE 1 TABLET (5 MG TOTAL) BY MOUTH DAILY. (Patient not taking: No sig reported) 30 tablet 11   No current facility-administered medications on file prior to visit.    No Known Allergies  Social History   Socioeconomic History   Marital status: Single    Spouse name: Not on file   Number of children: Not on file   Years of education: Not on file   Highest education level: Not on file  Occupational History    Comment: Works @ [employer]  Tobacco Use   Smoking status: Every Day    Types: Cigarettes    Passive exposure: Current   Smokeless tobacco: Never   Tobacco comments:    3 cigarettes / day  Vaping Use   Vaping Use: Never used  Substance and Sexual Activity   Alcohol use: Yes   Drug use: Yes    Types: Marijuana   Sexual activity: Yes    Birth control/protection: None, Pill  Other Topics Concern   Not on file  Social History Narrative   Not on file   Social Determinants of Health   Financial Resource Strain: Not on file  Food Insecurity: No Food Insecurity (03/19/2020)   Hunger Vital Sign    Worried About Running Out of Food in the Last Year: Never true    Ran  Out of Food in the Last Year: Never true  Transportation Needs: No Transportation Needs (03/19/2020)   PRAPARE - Administrator, Civil Service (Medical): No    Lack of Transportation (Non-Medical): No  Physical Activity: Not on file  Stress: Not on file  Social Connections: Not on file  Intimate Partner Violence: Not At Risk (11/01/2018)   Humiliation, Afraid, Rape, and Kick questionnaire    Fear of Current or Ex-Partner: No    Emotionally Abused: No    Physically Abused: No    Sexually Abused: No    Family History  Adopted: Yes  Problem Relation Age of Onset   Cancer Mother        Breast    Past Surgical History:  Procedure Laterality Date   NO PAST SURGERIES      ROS: Review of Systems Negative except as stated above  PHYSICAL EXAM: There were no vitals taken for this visit.  Physical Exam  {female adult master:310786} {female adult master:310785}     Latest Ref Rng & Units 08/14/2022   11:35 AM 04/09/2020   10:14 AM 05/21/2019    5:56 PM  CMP  Glucose 70 - 99 mg/dL 92  75  80   BUN 6 - 20 mg/dL 10  8  7    Creatinine 0.57 -  1.00 mg/dL 1.61  0.96  0.45   Sodium 134 - 144 mmol/L 139  135  136   Potassium 3.5 - 5.2 mmol/L 4.2  3.8  3.9   Chloride 96 - 106 mmol/L 105  106  107   CO2 20 - 29 mmol/L 19  18  21    Calcium 8.7 - 10.2 mg/dL 9.7  8.9  9.3   Total Protein 6.5 - 8.1 g/dL  6.1  5.9   Total Bilirubin 0.3 - 1.2 mg/dL  0.4  0.4   Alkaline Phos 38 - 126 U/L  202  203   AST 15 - 41 U/L  18  21   ALT 0 - 44 U/L  11  14    Lipid Panel  No results found for: "CHOL", "TRIG", "HDL", "CHOLHDL", "VLDL", "LDLCALC", "LDLDIRECT"  CBC    Component Value Date/Time   WBC 5.6 08/14/2022 1135   WBC 9.2 04/09/2020 1014   RBC 4.46 08/14/2022 1135   RBC 3.47 (L) 04/09/2020 1014   HGB 14.1 08/14/2022 1135   HCT 41.5 08/14/2022 1135   PLT 280 08/14/2022 1135   MCV 93 08/14/2022 1135   MCH 31.6 08/14/2022 1135   MCH 29.4 04/09/2020 1014   MCHC 34.0  08/14/2022 1135   MCHC 32.7 04/09/2020 1014   RDW 11.8 08/14/2022 1135   LYMPHSABS 1.1 10/17/2019 1032   EOSABS 0.1 10/17/2019 1032   BASOSABS 0.0 10/17/2019 1032    ASSESSMENT AND PLAN:  There are no diagnoses linked to this encounter.   Patient was given the opportunity to ask questions.  Patient verbalized understanding of the plan and was able to repeat key elements of the plan. Patient was given clear instructions to go to Emergency Department or return to medical center if symptoms don't improve, worsen, or new problems develop.The patient verbalized understanding.   No orders of the defined types were placed in this encounter.    Requested Prescriptions    No prescriptions requested or ordered in this encounter    No follow-ups on file.  Rema Fendt, NP

## 2022-09-16 ENCOUNTER — Telehealth (HOSPITAL_COMMUNITY): Payer: Self-pay | Admitting: Internal Medicine

## 2022-09-16 ENCOUNTER — Encounter: Payer: Medicaid Other | Admitting: Family

## 2022-09-16 DIAGNOSIS — Z0000 Encounter for general adult medical examination without abnormal findings: Secondary | ICD-10-CM

## 2022-09-16 DIAGNOSIS — Z13228 Encounter for screening for other metabolic disorders: Secondary | ICD-10-CM

## 2022-09-16 DIAGNOSIS — Z131 Encounter for screening for diabetes mellitus: Secondary | ICD-10-CM

## 2022-09-16 DIAGNOSIS — Z13220 Encounter for screening for lipoid disorders: Secondary | ICD-10-CM

## 2022-09-16 DIAGNOSIS — Z8632 Personal history of gestational diabetes: Secondary | ICD-10-CM

## 2022-09-16 NOTE — Telephone Encounter (Signed)
Patient called and cancelled echocardiogram scheduled for 09/17/22. She will call back to reschedule. Order will be removed from the active echo WQ. Thank you

## 2022-09-17 ENCOUNTER — Ambulatory Visit: Payer: Medicaid Other | Admitting: Diagnostic Neuroimaging

## 2022-09-17 ENCOUNTER — Encounter: Payer: Self-pay | Admitting: Diagnostic Neuroimaging

## 2022-09-17 ENCOUNTER — Ambulatory Visit (HOSPITAL_COMMUNITY): Payer: Medicaid Other

## 2022-11-11 NOTE — Progress Notes (Unsigned)
Cardiology Office Note:  .   Date:  11/12/2022  ID:  Christine Bailey, DOB February 05, 1996, MRN [id number] PCP: Patient, No Pcp Per  [facility] Providers Cardiologist:  Parke Poisson, MD    History of Present Illness: .   Christine Bailey is a 27 y.o. female past medical history of syncope, high risk pregnancy in 2021, tobacco use and chlamydia. She presents today for follow up regarding syncope.   In 07/13/2022 she establish care with her PCP.  She reported intermittent chronic migraines since 2021 following the birth of her child for about 4 times a week.  She also reported syncopal episodes since 2021 as well. She was referred to neurology and cardiology.   She establish care with Dr. Jacques Navy on 08/14/2022 regarding the syncopal episodes.  At time her most recent episode had been 1 month prior to appointment that occurred while she was cooking for her children, described feeling a sudden heat wave prior to losing consciousness.  Did not seek medical evaluation following event.  Prior to this event her last syncopal episode have been 1 to 2 years prior she was getting her hair braided.  Appeared that her syncopal episodes were random in nature but associated with a prodrome of lightheadedness and some dizziness very fast onset. Dr. Jacques Navy felt that her syncopal episdoes were likely neurocardiogenic in origin, recommended obtaining labs and echocardiogram for further assessment.  Her lab results were unremarkable and echo has not yet been completed.  Today she reports that she has been doing well, she has had no further syncopal events.  She does have however report presyncope, consisting of dizziness and lightheadedness, if she is able to sit down her symptoms will subside.  She denies feelings of palpitations with these events.  She also reports rare episodes of chest tightness with some slight shortness of breath associated with anxiety, has never occurred with exertion, generally occurs at night.   She continues smoking 4 to 6 cigarettes daily, also endorses marijuana use, denies further recreational drug use.  States she drinks very little caffeine, maybe 1 cup of tea a week and drinks alcohol all socially, once every other week.  Reports that she works very hard to stay well-hydrated.  ROS: Today she denies lower extremity edema, fatigue, palpitations, melena, hematuria, hemoptysis, diaphoresis, weakness, orthopnea, and PND. All other systems reviewed and are otherwise negative except as noted above.    Physical Exam:   VS:  BP 122/74 (BP Location: Left Arm, Patient Position: Sitting, Cuff Size: Normal)   Pulse 78   Ht 5\' 7"  (1.702 m)   Wt 207 lb 12.8 oz (94.3 kg)   SpO2 97%   BMI 32.55 kg/m    Wt Readings from Last 3 Encounters:  11/12/22 207 lb 12.8 oz (94.3 kg)  08/14/22 213 lb 3.2 oz (96.7 kg)  07/13/22 212 lb (96.2 kg)    GEN: Well nourished, well developed in no acute distress NECK: No JVD; No carotid bruits CARDIAC: RRR, no murmurs, rubs, gallops RESPIRATORY:  Clear to auscultation without rales, wheezing or rhonchi  ABDOMEN: Soft, non-tender, non-distended EXTREMITIES:  No edema; No deformity   ASSESSMENT AND PLAN: .    Syncope: Reports intermittent syncopal episodes following the birth of her daughter in 2021.  Prodrome symptoms consist of dizziness and lightheadedness with very fast onset.  Lab work completed in April was unremarkable, echocardiogram was recommended by Dr.Acharya, this has not been completed.  Today she denies further episodes of syncope but has  been having presyncope that resolves with rest.  States that she will become dizzy and lightheaded and feel loose all over, but will then sit down with resolution of symptoms. She denies feelings of palpitations with these events. Reviewed ED precautions. Recommended she complete her echocardiogram for further evaluation. Will also have her wear a 2 week ZIO to assess for arrhythmias given ongoing presyncope.    Chest tightness: Notes rare episodes of chest tightness and shortness of breath not associated with exertion. States this this is not new and has been ongoing for years, has not had recent episode. Questions if this is possibly related to anxiety. Echo and cardiac monitor pending as above. She will monitor for now, if symptoms persist or if occur with exertion can consider exercise stress testing.   Tobacco use: Discussed and recommended cessation, she is not interested at this time.     Dispo: Follow up in 6-8 weeks, following echocardiogram.   Signed, Rip Harbour, NP

## 2022-11-12 ENCOUNTER — Ambulatory Visit: Payer: 59

## 2022-11-12 ENCOUNTER — Ambulatory Visit: Payer: 59 | Attending: Nurse Practitioner | Admitting: Cardiology

## 2022-11-12 ENCOUNTER — Encounter: Payer: Self-pay | Admitting: Nurse Practitioner

## 2022-11-12 VITALS — BP 122/74 | HR 78 | Ht 67.0 in | Wt 207.8 lb

## 2022-11-12 DIAGNOSIS — Z720 Tobacco use: Secondary | ICD-10-CM

## 2022-11-12 DIAGNOSIS — R55 Syncope and collapse: Secondary | ICD-10-CM

## 2022-11-12 DIAGNOSIS — R0789 Other chest pain: Secondary | ICD-10-CM

## 2022-11-12 NOTE — Progress Notes (Unsigned)
Enrolled for [employer] to mail a ZIO XT long term holter monitor to the patients address on file.   Dr. Acharya to read. 

## 2022-11-12 NOTE — Patient Instructions (Signed)
Medication Instructions:  Your physician recommends that you continue on your current medications as directed. Please refer to the Current Medication list given to you today.  *If you need a refill on your cardiac medications before your next appointment, please call your pharmacy*   Lab Work: None  If you have labs (blood work) drawn today and your tests are completely normal, you will receive your results only by: MyChart Message (if you have MyChart) OR A paper copy in the mail If you have any lab test that is abnormal or we need to change your treatment, we will call you to review the results.   Testing/Procedures: Your physician has requested that you have an echocardiogram. Echocardiography is a painless test that uses sound waves to create images of your heart. It provides your doctor with information about the size and shape of your heart and how well your heart's chambers and valves are working. This procedure takes approximately one hour. There are no restrictions for this procedure. Please do NOT wear cologne, perfume, aftershave, or lotions (deodorant is allowed). Please arrive 15 minutes prior to your appointment time.  ZIO XT- Long Term Monitor Instructions  Your physician has requested you wear a ZIO patch monitor for 14 days.  This is a single patch monitor. [employer] supplies one patch monitor per enrollment. Additional stickers are not available. Please do not apply patch if you will be having a Nuclear Stress Test,  Echocardiogram, Cardiac CT, MRI, or Chest Xray during the period you would be wearing the  monitor. The patch cannot be worn during these tests. You cannot remove and re-apply the  ZIO XT patch monitor.  Your ZIO patch monitor will be mailed 3 day USPS to your address on file. It may take 3-5 days  to receive your monitor after you have been enrolled.  Once you have received your monitor, please review the enclosed instructions. Your monitor  has already  been registered assigning a specific monitor serial # to you.  Billing and Patient Assistance Program Information  We have supplied [employer] with any of your insurance information on file for billing purposes. [employer] offers a sliding scale Patient Assistance Program for patients that do not have  insurance, or whose insurance does not completely cover the cost of the ZIO monitor.  You must apply for the Patient Assistance Program to qualify for this discounted rate.  To apply, please call [employer] at [phone], select option 4, select option 2, ask to apply for  Patient Assistance Program. Meredeth Ide will ask your household income, and how many people  are in your household. They will quote your out-of-pocket cost based on that information.  [employer] will also be able to set up a 43-month, interest-free payment plan if needed.  Applying the monitor   Shave hair from upper left chest.  Hold abrader disc by orange tab. Rub abrader in 40 strokes over the upper left chest as  indicated in your monitor instructions.  Clean area with 4 enclosed alcohol pads. Let dry.  Apply patch as indicated in monitor instructions. Patch will be placed under collarbone on left  side of chest with arrow pointing upward.  Rub patch adhesive wings for 2 minutes. Remove white label marked "1". Remove the white  label marked "2". Rub patch adhesive wings for 2 additional minutes.  While looking in a mirror, press and release button in center of patch. A small green light will  flash 3-4 times. This will be your only indicator  that the monitor has been turned on.  Do not shower for the first 24 hours. You may shower after the first 24 hours.  Press the button if you feel a symptom. You will hear a small click. Record Date, Time and  Symptom in the Patient Logbook.  When you are ready to remove the patch, follow instructions on the last 2 pages of Patient  Logbook. Stick patch monitor onto the last page of Patient  Logbook.  Place Patient Logbook in the blue and white box. Use locking tab on box and tape box closed  securely. The blue and white box has prepaid postage on it. Please place it in the mailbox as  soon as possible. Your physician should have your test results approximately 7 days after the  monitor has been mailed back to [facility].  Call [facility] Customer Care at [phone] if you have questions regarding  your ZIO XT patch monitor. Call them immediately if you see an orange light blinking on your  monitor.  If your monitor falls off in less than 4 days, contact our Monitor department at [phone].  If your monitor becomes loose or falls off after 4 days call [employer] at [phone] for  suggestions on securing your monitor    Follow-Up: At [facility], you and your health needs are our priority.  As part of our continuing mission to provide you with exceptional heart care, we have created designated Provider Care Teams.  These Care Teams include your primary Cardiologist (physician) and Advanced Practice Providers (APPs -  Physician Assistants and Nurse Practitioners) who all work together to provide you with the care you need, when you need it.  We recommend signing up for the patient portal called "MyChart".  Sign up information is provided on this After Visit Summary.  MyChart is used to connect with patients for Virtual Visits (Telemedicine).  Patients are able to view lab/test results, encounter notes, upcoming appointments, etc.  Non-urgent messages can be sent to your provider as well.   To learn more about what you can do with MyChart, go to [email or web address].    Your next appointment:   6 week(s)  Provider:   Marjie Skiff, PA-C

## 2022-12-04 ENCOUNTER — Other Ambulatory Visit (HOSPITAL_COMMUNITY): Payer: Self-pay | Admitting: Internal Medicine

## 2022-12-04 ENCOUNTER — Ambulatory Visit (HOSPITAL_COMMUNITY): Payer: 59 | Attending: Cardiology

## 2022-12-04 DIAGNOSIS — R0989 Other specified symptoms and signs involving the circulatory and respiratory systems: Secondary | ICD-10-CM | POA: Diagnosis not present

## 2022-12-04 DIAGNOSIS — R55 Syncope and collapse: Secondary | ICD-10-CM | POA: Diagnosis not present

## 2022-12-04 LAB — ECHOCARDIOGRAM COMPLETE
Area-P 1/2: 4.1 cm2
S' Lateral: 2.85 cm

## 2022-12-22 NOTE — Progress Notes (Deleted)
Cardiology Office Note:    Date:  12/22/2022   ID:  Christine Bailey, DOB 1995-05-12, MRN [id number]  PCP:  Patient, No Pcp Per  Cardiologist:  Parke Poisson, MD  Electrophysiologist:  None   Referring MD: No ref. provider found   Chief Complaint: follow-up of syncope  History of Present Illness:    Christine Bailey is a 27 y.o. female with a history of syncope, migraines, high risk pregnancy in 2021, and tobacco abuse who is followed by Dr. Jacques Navy and presents today for follow-up of syncope.   Patient was referred to Dr. Jacques Navy in 07/2022 for further evaluation of multiple syncopal episodes. She reported intermittent migraines and syncopal episodes. She also reported some short-term and long-term memory loss since 2021. PCP referred her to both Cardiology and Neurology. At the initial visit with Dr. Jacques Navy, she reported her first syncopal episodes was in 2018 while she was pregnant with her first daughter and her most recent episode was a few months. Generally, these episodes would occur randomly and were preceded by lightheadedness/ dizziness with a very fast onset. Syncope sounded neurocardiogenic in origin. Echo was ordered for further evaluation with plans for a cardiac monitor if this was unrevealing. She was last seen by Reather Littler, NP, 10/2022 at which time she denied any recurrent syncopal episodes but did report some lightheadedness, dizziness, and pre-syncope that would subside with sitting down. She also reported rare episodes of chest tightness with some slight shortness of breath associated with anxiety. Chest discomfort was not felt to be cardiac and possible related to anxiety. She had not had the Echo completed yet.  Therefore, this was reordered. A 2 week Zio monitor was also ordered. Echo showed LVEF of 60-65% with normal wall motion and diastolic parameters and no significant valvular disease. Monitor showed ***.  Recurrent Syncope Patient has a history of recurrent syncope  dating back to 2018. Episodes were preceded by lightheadedness/ dizziness. Echo in 11/2022 showed normal LV function and no significant valvular disease. Monitor in *** - *** - Sounds neurocardiogenic in nature. ***  Chest Tightness ***  Tobacco Abuse ***  EKGs/Labs/Other Studies Reviewed:    The following studies were reviewed:  Echocardiogram 12/04/2022: Impressions: 1. Left ventricular ejection fraction, by estimation, is 60 to 65%. The  left ventricle has normal function. The left ventricle has no regional  wall motion abnormalities. Left ventricular diastolic parameters were  normal.   2. Right ventricular systolic function is normal. The right ventricular  size is normal.   3. The mitral valve is normal in structure. Trivial mitral valve  regurgitation. No evidence of mitral stenosis.   4. The aortic valve is normal in structure. Aortic valve regurgitation is  trivial. No aortic stenosis is present.   5. The inferior vena cava is normal in size with greater than 50%  respiratory variability, suggesting right atrial pressure of 3 mmHg.  _______________  Monitor ***  EKG:  EKG not ordered today.   Recent Labs: 08/14/2022: BUN 10; Creatinine, Ser 0.65; Hemoglobin 14.1; Platelets 280; Potassium 4.2; Sodium 139; TSH 0.669  Recent Lipid Panel No results found for: "CHOL", "TRIG", "HDL", "CHOLHDL", "VLDL", "LDLCALC", "LDLDIRECT"  Physical Exam:    Vital Signs: There were no vitals taken for this visit.    Wt Readings from Last 3 Encounters:  11/12/22 207 lb 12.8 oz (94.3 kg)  08/14/22 213 lb 3.2 oz (96.7 kg)  07/13/22 212 lb (96.2 kg)     General: 27 y.o. female in  no acute distress. HEENT: Normocephalic and atraumatic. Sclera clear. EOMs intact. Neck: Supple. No carotid bruits. No JVD. Heart: *** RRR. Distinct S1 and S2. No murmurs, gallops, or rubs. Radial and distal pedal pulses 2+ and equal bilaterally. Lungs: No increased work of breathing. Clear to ausculation  bilaterally. No wheezes, rhonchi, or rales.  Abdomen: Soft, non-distended, and non-tender to palpation. Bowel sounds present in all 4 quadrants.  MSK: Normal strength and tone for age. *** Extremities: No lower extremity edema.    Skin: Warm and dry. Neuro: Alert and oriented x3. No focal deficits. Psych: Normal affect. Responds appropriately.   Assessment:    No diagnosis found.  Plan:     Disposition: Follow up in ***   Medication Adjustments/Labs and Tests Ordered: Current medicines are reviewed at length with the patient today.  Concerns regarding medicines are outlined above.  No orders of the defined types were placed in this encounter.  No orders of the defined types were placed in this encounter.   There are no Patient Instructions on file for this visit.   Signed, Corrin Parker, PA-C  12/22/2022 3:50 PM    [facility]

## 2022-12-27 NOTE — Progress Notes (Deleted)
Cardiology Office Note:  .   Date:  12/27/2022  ID:  Christine Bailey, DOB 1995-11-01, MRN [id number] PCP: Patient, No Pcp Per  [facility] Providers Cardiologist:  Parke Poisson, MD { Click to update primary MD,subspecialty MD or APP then REFRESH:1}   History of Present Illness: .   Christine Bailey is a 27 y.o. female with past medical history of syncope, high risk pregnancy in 2021, tobacco use and chlamydia. She presents today for follow up regarding syncope.   In 07/13/2022 she established care with her PCP.  She reported intermittent chronic migraines since 2021 following the birth of her child for about 4 times a week.  She also reported syncopal episodes since 2021 as well. She was referred to neurology and cardiology.   She establish care with Dr. Jacques Navy on 08/14/2022 regarding the syncopal episodes.  At time her most recent episode had been 1 month prior to appointment that occurred while she was cooking for her children, described feeling a sudden heat wave prior to losing consciousness.  Did not seek medical evaluation following event.  Prior to this event her last syncopal episode have been 1 to 2 years prior she was getting her hair braided.  Appeared that her syncopal episodes were random in nature but associated with a prodrome of lightheadedness and some dizziness very fast onset. Dr. Jacques Navy felt that her syncopal episdoes were likely neurocardiogenic in origin, recommended obtaining labs and echocardiogram for further assessment.  Her lab results were unremarkable. She was last seen in clinic by myself on 11/12/22. She reported no further syncopal events, although she did note presyncope consisting of dizziness and lightheadedness, resolving with rest.  She also reported rare episodes of chest tightness with some slight shortness of breath associated with anxiety, had never occurred with exertion, generally occured at night.  She continues smoking 4 to 6 cigarettes daily, also  endorses marijuana use, denied further recreational drug use.    Her echocardiogram on 12/04/2022 indicated LVEF of 60-65 and, LV with normal function, no RWMA and normal diastolic parameters.  Trivial mitral valve and aortic valve regurgitation.  Cardiac monitor revealed  Today she presents for follow up. She states that she has been doing    Syncope: Reports intermittent syncopal episodes following the birth of her daughter in 2021.  Prodrome symptoms consist of dizziness and lightheadedness with very fast onset.  Lab work completed in April was unremarkable, echocardiogram was recommended by Dr.Acharya, this has not been completed.  Today she denies further episodes of syncope but has been having presyncope that resolves with rest.  States that she will become dizzy and lightheaded and feel loose all over, but will then sit down with resolution of symptoms. She denies feelings of palpitations with these events.  Monitor indicated  Reviewed ED precautions   Chest tightness: Notes rare episodes of chest tightness and shortness of breath not associated with exertion. States this this is not new and has been ongoing for years, has not had recent episode. Questions if this is possibly related to anxiety. Her echocardiogram on 12/04/2022 indicated LVEF of 60-65 and, LV with normal function, no RWMA and normal diastolic parameters.  Trivial mitral valve and aortic valve regurgitation.  She will monitor for now, if symptoms persist or if occur with exertion can consider exercise stress testing.  Echo   Tobacco use: Discussed and recommended cessation, she is not interested at this time.   ROS: ****** denies chest pain, shortness of breath, lower extremity  edema, fatigue, palpitations, melena, hematuria, hemoptysis, diaphoresis, weakness, presyncope, syncope, orthopnea, and PND.   Studies Reviewed: .       Cardiac Studies & Procedures       ECHOCARDIOGRAM  ECHOCARDIOGRAM COMPLETE  12/04/2022  Narrative ECHOCARDIOGRAM REPORT    Patient Name:   Christine Bailey  Date of Exam: 12/04/2022 Medical Rec #:  [id number]     Height:       67.0 in Accession #:    [id number]    Weight:       207.8 lb Date of Birth:  1995-07-28    BSA:          2.055 m Patient Age:    26 years      BP:           122/74 mmHg Patient Gender: F             HR:           85 bpm. Exam Location:  [address]  Procedure: 2D Echo, 3D Echo, Cardiac Doppler, Color Doppler and Strain Analysis  Indications:    R55 Syncope  History:        Patient has no prior history of Echocardiogram examinations. Signs/Symptoms:Chest Pain, Shortness of Breath, Dizziness/Lightheadedness and Syncope; Risk Factors:Current Smoker. Migraine Headaches.  Sonographer:    Farrel Conners RDCS Referring Phys: Parke Poisson  IMPRESSIONS   1. Left ventricular ejection fraction, by estimation, is 60 to 65%. The left ventricle has normal function. The left ventricle has no regional wall motion abnormalities. Left ventricular diastolic parameters were normal. 2. Right ventricular systolic function is normal. The right ventricular size is normal. 3. The mitral valve is normal in structure. Trivial mitral valve regurgitation. No evidence of mitral stenosis. 4. The aortic valve is normal in structure. Aortic valve regurgitation is trivial. No aortic stenosis is present. 5. The inferior vena cava is normal in size with greater than 50% respiratory variability, suggesting right atrial pressure of 3 mmHg.  FINDINGS Left Ventricle: Left ventricular ejection fraction, by estimation, is 60 to 65%. The left ventricle has normal function. The left ventricle has no regional wall motion abnormalities. The left ventricular internal cavity size was normal in size. There is no left ventricular hypertrophy. Left ventricular diastolic parameters were normal.  Right Ventricle: The right ventricular size is normal. No increase in right  ventricular wall thickness. Right ventricular systolic function is normal.  Left Atrium: Left atrial size was normal in size.  Right Atrium: Right atrial size was normal in size.  Pericardium: There is no evidence of pericardial effusion.  Mitral Valve: The mitral valve is normal in structure. Trivial mitral valve regurgitation. No evidence of mitral valve stenosis.  Tricuspid Valve: The tricuspid valve is normal in structure. Tricuspid valve regurgitation is trivial. No evidence of tricuspid stenosis.  Aortic Valve: The aortic valve is normal in structure. Aortic valve regurgitation is trivial. No aortic stenosis is present.  Pulmonic Valve: The pulmonic valve was normal in structure. Pulmonic valve regurgitation is trivial. No evidence of pulmonic stenosis.  Aorta: The aortic root is normal in size and structure.  Venous: The inferior vena cava is normal in size with greater than 50% respiratory variability, suggesting right atrial pressure of 3 mmHg.  IAS/Shunts: No atrial level shunt detected by color flow Doppler.   LEFT VENTRICLE PLAX 2D LVIDd:         4.85 cm   Diastology LVIDs:         2.85 cm  LV e' medial:    8.92 cm/s LV PW:         1.00 cm   LV E/e' medial:  9.6 LV IVS:        0.85 cm   LV e' lateral:   12.10 cm/s LVOT diam:     2.20 cm   LV E/e' lateral: 7.1 LV SV:         76 LV SV Index:   37        2D Longitudinal Strain LVOT Area:     3.80 cm  2D Strain GLS (A2C):   -22.3 % 2D Strain GLS (A3C):   -24.3 % 2D Strain GLS (A4C):   -23.9 % 2D Strain GLS Avg:     -23.5 %  3D Volume EF: 3D EF:        77 % LV EDV:       139 ml LV ESV:       32 ml LV SV:        106 ml  RIGHT VENTRICLE RV Basal diam:  3.60 cm RV S prime:     14.30 cm/s TAPSE (M-mode): 2.5 cm  LEFT ATRIUM             Index        RIGHT ATRIUM           Index LA diam:        3.40 cm 1.65 cm/m   RA Pressure: 3.00 mmHg LA Vol (A2C):   37.9 ml 18.44 ml/m  RA Area:     12.30 cm LA Vol (A4C):    45.4 ml 22.09 ml/m  RA Volume:   30.00 ml  14.60 ml/m LA Biplane Vol: 41.8 ml 20.34 ml/m AORTIC VALVE LVOT Vmax:   107.00 cm/s LVOT Vmean:  68.400 cm/s LVOT VTI:    0.200 m  AORTA Ao Root diam: 2.40 cm Ao Asc diam:  2.90 cm  MITRAL VALVE               TRICUSPID VALVE MV Area (PHT): cm         Estimated RAP:  3.00 mmHg MV Decel Time: 185 msec MV E velocity: 85.95 cm/s  SHUNTS MV A velocity: 79.65 cm/s  Systemic VTI:  0.20 m MV E/A ratio:  1.08        Systemic Diam: 2.20 cm  Arvilla Meres MD Electronically signed by Arvilla Meres MD Signature Date/Time: 12/04/2022/1:03:07 PM    Final             *** Risk Assessment/Calculations:   {Does this patient have ATRIAL FIBRILLATION?:[phone]} No BP recorded.  {Refresh Note OR Click here to enter BP  :1}***       Physical Exam:   VS:  There were no vitals taken for this visit.   Wt Readings from Last 3 Encounters:  11/12/22 207 lb 12.8 oz (94.3 kg)  08/14/22 213 lb 3.2 oz (96.7 kg)  07/13/22 212 lb (96.2 kg)    GEN: Well nourished, well developed in no acute distress NECK: No JVD; No carotid bruits CARDIAC: ***RRR, no murmurs, rubs, gallops RESPIRATORY:  Clear to auscultation without rales, wheezing or rhonchi  ABDOMEN: Soft, non-tender, non-distended EXTREMITIES:  No edema; No deformity   ASSESSMENT AND PLAN: .   ***    {Are you ordering a CV Procedure (e.g. stress test, cath, DCCV, TEE, etc)?   Press F2        :[id number]}  Dispo: ***  Signed, Charlesetta Garibaldi  Armando Gang, NP

## 2022-12-29 ENCOUNTER — Ambulatory Visit: Payer: 59 | Attending: Student | Admitting: Student

## 2022-12-29 DIAGNOSIS — Z720 Tobacco use: Secondary | ICD-10-CM

## 2022-12-29 DIAGNOSIS — R55 Syncope and collapse: Secondary | ICD-10-CM

## 2022-12-29 DIAGNOSIS — R0789 Other chest pain: Secondary | ICD-10-CM

## 2022-12-30 ENCOUNTER — Encounter: Payer: Self-pay | Admitting: Student

## 2023-02-09 ENCOUNTER — Ambulatory Visit: Payer: 59 | Admitting: Diagnostic Neuroimaging

## 2023-08-31 DIAGNOSIS — Z3202 Encounter for pregnancy test, result negative: Secondary | ICD-10-CM | POA: Diagnosis not present

## 2023-08-31 DIAGNOSIS — Z113 Encounter for screening for infections with a predominantly sexual mode of transmission: Secondary | ICD-10-CM | POA: Diagnosis not present

## 2023-08-31 DIAGNOSIS — Z114 Encounter for screening for human immunodeficiency virus [HIV]: Secondary | ICD-10-CM | POA: Diagnosis not present

## 2023-08-31 DIAGNOSIS — Z3041 Encounter for surveillance of contraceptive pills: Secondary | ICD-10-CM | POA: Diagnosis not present

## 2023-11-18 ENCOUNTER — Ambulatory Visit
Admission: EM | Admit: 2023-11-18 | Discharge: 2023-11-18 | Disposition: A | Discharge status: Home / Self Care | Attending: Family Medicine | Admitting: Family Medicine

## 2023-11-18 ENCOUNTER — Encounter: Payer: Self-pay | Admitting: Emergency Medicine

## 2023-11-18 DIAGNOSIS — N949 Unspecified condition associated with female genital organs and menstrual cycle: Secondary | ICD-10-CM | POA: Insufficient documentation

## 2023-11-18 DIAGNOSIS — Z113 Encounter for screening for infections with a predominantly sexual mode of transmission: Secondary | ICD-10-CM | POA: Diagnosis not present

## 2023-11-18 NOTE — ED Provider Notes (Signed)
 [facility]   [id number] 11/18/23 Arrival Time: 1123  ASSESSMENT & PLAN:  1. Screening for STDs (sexually transmitted diseases)   2. Vaginal discomfort       Discharge Instructions      We have sent testing for various causes of vaginal infections. We will notify you of any positive results once they are received. If required, we will prescribe any medications you might need.  Please refrain from all sexual activity for at least the next seven days.     Labs Reviewed  CERVICOVAGINAL ANCILLARY ONLY    Will notify of any positive results. Instructed to refrain from sexual activity for at least seven days.  Reviewed expectations re: course of current medical issues. Questions answered. Outlined signs and symptoms indicating need for more acute intervention. Patient verbalized understanding. After Visit Summary given.   SUBJECTIVE:  Daily Doe is a 28 y.o. female who requests STI testing. Mild vaginal discomfort. Denies vaginal discharge.  Patient's last menstrual period was 10/31/2023 (exact date).   OBJECTIVE:  Vitals:   11/18/23 1208 11/18/23 1209  BP:  138/83  Pulse:  81  Resp:  18  Temp:  98 F (36.7 C)  TempSrc:  Oral  SpO2:  97%  Weight: 98.4 kg      General appearance: alert, cooperative, appears stated age and no distress Lungs: unlabored respirations; speaks full sentences without difficulty GU: deferred Skin: warm and dry Psychological: alert and cooperative; normal mood and affect.  Results for orders placed or performed in visit on 12/04/22  ECHOCARDIOGRAM COMPLETE   Collection Time: 12/04/22 11:55 AM  Result Value Ref Range   Area-P 1/2 4.10 cm2   S' Lateral 2.85 cm   Est EF 60 - 65%     Labs Reviewed  CERVICOVAGINAL ANCILLARY ONLY    No Known Allergies  Past Medical History:  Diagnosis Date   Chlamydia    Eczema    Supervision of high risk pregnancy, antepartum 10/10/2019    Nursing Staff Provider Office  Location  [facility] Dating    1st trimester US  Language   English Anatomy US    normal Flu Vaccine  Declined  Genetic Screen  NIPS:  Low risk female  AFP:  Screen negative   TDaP vaccine   03/05/20 Hgb A1C or  GTT Early  Third trimester:    Ref. 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, early years/pre  Tobacco Use   Smoking status: Every Day    Types: Cigarettes    Passive exposure: Current   Smokeless tobacco: Never   Tobacco comments:    3 cigarettes / day  Vaping Use   Vaping status: Never Used  Substance and Sexual Activity   Alcohol use: Yes   Drug use: Yes    Types: Marijuana   Sexual activity: Yes    Birth control/protection: None, Pill  Other Topics Concern   Not on file  Social History Narrative   Not on file   Social Drivers of Health   Financial Resource Strain: Not on file  Food Insecurity: No Food Insecurity (03/19/2020)   Hunger Vital Sign    Worried About Running Out of Food in the Last Year: Never true    Ran Out of Food in the Last Year: Never true  Transportation Needs: No Transportation Needs (03/19/2020)   PRAPARE - Administrator, Civil Service (Medical): No    Lack of Transportation (Non-Medical): No  Physical Activity: Not on file  Stress: Not on file  Social Connections: Not on file  Intimate Partner Violence: Not At Risk (11/01/2018)   Humiliation, Afraid, Rape, and Kick questionnaire    Fear of Current or Ex-Partner: No    Emotionally Abused: No    Physically Abused: No    Sexually Abused: No            Rolinda Rogue, MD 11/18/23 1317

## 2023-11-18 NOTE — ED Triage Notes (Signed)
 Pt presents requesting STD testing. Pt says she has no sxs but does feel uncomfortable in her vaginal area.

## 2023-11-18 NOTE — Discharge Instructions (Signed)
We have sent testing for various causes of vaginal infections. We will notify you of any positive results once they are received. If required, we will prescribe any medications you might need.  Please refrain from all sexual activity for at least the next seven days.  

## 2023-11-19 ENCOUNTER — Ambulatory Visit (HOSPITAL_COMMUNITY): Payer: Self-pay

## 2023-11-19 LAB — CERVICOVAGINAL ANCILLARY ONLY
Bacterial Vaginitis (gardnerella): POSITIVE — AB
Candida Glabrata: NEGATIVE
Candida Vaginitis: POSITIVE — AB
Chlamydia: NEGATIVE
Comment: NEGATIVE
Comment: NEGATIVE
Comment: NEGATIVE
Comment: NEGATIVE
Comment: NEGATIVE
Comment: NORMAL
Neisseria Gonorrhea: NEGATIVE
Trichomonas: NEGATIVE

## 2023-11-19 MED ORDER — METRONIDAZOLE 500 MG PO TABS: 500.0000 mg | ORAL_TABLET | Freq: Two times a day (BID) | ORAL | 0 refills | Status: AC

## 2023-11-19 MED ORDER — FLUCONAZOLE 150 MG PO TABS: 150.0000 mg | ORAL_TABLET | Freq: Once | ORAL | 0 refills | Status: AC

## 2023-12-16 ENCOUNTER — Encounter: Payer: Self-pay | Admitting: Emergency Medicine

## 2023-12-16 ENCOUNTER — Ambulatory Visit
Admission: EM | Admit: 2023-12-16 | Discharge: 2023-12-16 | Disposition: A | Discharge status: Home / Self Care | Attending: Physician Assistant | Admitting: Physician Assistant

## 2023-12-16 DIAGNOSIS — Z113 Encounter for screening for infections with a predominantly sexual mode of transmission: Secondary | ICD-10-CM | POA: Insufficient documentation

## 2023-12-16 LAB — POCT URINE PREGNANCY: Preg Test, Ur: NEGATIVE

## 2023-12-16 NOTE — ED Triage Notes (Signed)
 Pt presents requesting STD testing including blood work as well as pregnancy test. Pt says she has a new partner and now is having abdominal cramping since the new partner.

## 2023-12-16 NOTE — ED Provider Notes (Signed)
 [facility]    CSN: [id number] Arrival date & time: 12/16/23  9085      History   Chief Complaint Chief Complaint  Patient presents with   Exposure to STD    HPI Christine Bailey is a 28 y.o. female.   Patient here today for STD screening as well as pregnancy screening.  She reports she has had some lower abdominal cramping but no other symptoms.  She does have a new partner.  She denies any vaginal discharge.  The history is provided by the patient.  Exposure to STD Associated symptoms include abdominal pain. Pertinent negatives include no shortness of breath.    Past Medical History:  Diagnosis Date   Chlamydia    Eczema    Supervision of high risk pregnancy, antepartum 10/10/2019    Nursing Staff Provider Office Location  [facility] Dating    1st trimester US  Language   English Anatomy US    normal Flu Vaccine  Declined  Genetic Screen  NIPS:  Low risk female  AFP:  Screen negative   TDaP vaccine   03/05/20 Hgb A1C or  GTT Early  Third trimester:    Ref. 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 Authorizing Provider  metroNIDAZOLE  (FLAGYL ) 500 MG tablet Take 1 tablet (500 mg  total) by mouth 2 (two) times daily. 11/19/23   Banister, Pamela K, MD  norgestimate-ethinyl estradiol (ORTHO-CYCLEN) 0.25-35 MG-MCG tablet Take 1 tablet by mouth daily. 05/23/22   [provider]  triamcinolone cream (KENALOG) 0.1 % Apply topically 2 (two) times daily. 02/14/16   [provider]  amLODipine  (NORVASC ) 5 MG tablet TAKE 1 TABLET (5 MG TOTAL) BY MOUTH DAILY. Patient not taking: No sig reported 04/11/20 07/25/20  Trudy Earnie CROME, CNM    Family History Family History  Adopted: Yes  Problem Relation Age of Onset   Cancer Mother        Breast    Social History Social History   Tobacco Use   Smoking status: Every Day    Types: Cigarettes    Passive exposure: Current   Smokeless tobacco: Never   Tobacco comments:    3 cigarettes / day  Vaping Use   Vaping status: Never Used  Substance Use Topics   Alcohol use: Yes   Drug use: Yes    Types: Marijuana     Allergies  Patient has no known allergies.   Review of Systems Review of Systems  Constitutional:  Negative for chills and fever.  Eyes:  Negative for discharge and redness.  Respiratory:  Negative for shortness of breath.   Gastrointestinal:  Positive for abdominal pain. Negative for nausea and vomiting.  Genitourinary:  Negative for genital sores and vaginal discharge.     Physical Exam Triage Vital Signs ED Triage Vitals  Encounter Vitals Group     BP      Girls Systolic BP Percentile      Girls Diastolic BP Percentile      Boys Systolic BP Percentile      Boys Diastolic BP Percentile      Pulse      Resp      Temp      Temp src      SpO2      Weight      Height      Head Circumference      Peak Flow      Pain Score      Pain Loc      Pain Education      Exclude from Growth Chart    No data found.  Updated Vital Signs BP 122/79 (BP Location: Left Arm)   Pulse 85   Temp 98.4 F (36.9 C) (Oral)   Resp 18   Wt 216 lb 14.9 oz (98.4 kg)   LMP 10/31/2023 (Exact Date)    SpO2 97%   BMI 33.98 kg/m   Visual Acuity Right Eye Distance:   Left Eye Distance:   Bilateral Distance:    Right Eye Near:   Left Eye Near:    Bilateral Near:     Physical Exam Vitals and nursing note reviewed.  Constitutional:      General: She is not in acute distress.    Appearance: Normal appearance. She is not ill-appearing.  HENT:     Head: Normocephalic and atraumatic.  Eyes:     Conjunctiva/sclera: Conjunctivae normal.  Cardiovascular:     Rate and Rhythm: Normal rate.  Pulmonary:     Effort: Pulmonary effort is normal. No respiratory distress.  Neurological:     Mental Status: She is alert.  Psychiatric:        Mood and Affect: Mood normal.        Behavior: Behavior normal.        Thought Content: Thought content normal.      UC Treatments / Results  Labs (all labs ordered are listed, but only abnormal results are displayed) Labs Reviewed  POCT URINE PREGNANCY - Normal  RPR  HIV ANTIBODY (ROUTINE TESTING W REFLEX)  CERVICOVAGINAL ANCILLARY ONLY    EKG   Radiology No results found.  Procedures Procedures (including critical care time)  Medications Ordered in UC Medications - No data to display  Initial Impression / Assessment and Plan / UC Course  I have reviewed the triage vital signs and the nursing notes.  Pertinent labs & imaging results that were available during my care of the patient were reviewed by me and considered in my medical decision making (see chart for details).    Pregnancy screening negative.  STD screening ordered as requested.  Advised to abstain from sexual activity while awaiting results.  Encouraged follow-up with any further concerns.  Will await results for further recommendation.  Final Clinical Impressions(s) / UC Diagnoses   Final diagnoses:  Screening for STD (sexually transmitted disease)   Discharge Instructions  None    ED Prescriptions   None    PDMP not reviewed this encounter.   Billy Asberry FALCON, PA-C 12/16/23 1008

## 2023-12-17 LAB — HIV ANTIBODY (ROUTINE TESTING W REFLEX): HIV Screen 4th Generation wRfx: NONREACTIVE

## 2023-12-17 LAB — CERVICOVAGINAL ANCILLARY ONLY
Chlamydia: NEGATIVE
Comment: NEGATIVE
Comment: NEGATIVE
Comment: NORMAL
Neisseria Gonorrhea: NEGATIVE
Trichomonas: NEGATIVE

## 2023-12-17 LAB — RPR: RPR Ser Ql: NONREACTIVE

## 2024-05-29 ENCOUNTER — Ambulatory Visit: Payer: Self-pay | Admitting: Family

## 2024-06-22 ENCOUNTER — Encounter: Admitting: Obstetrics and Gynecology
